# Patient Record
Sex: Female | Born: 1990
Health system: Southern US, Community
[De-identification: ages and names within clinical notes are randomized; demographics above are authoritative.]

## PROBLEM LIST (undated history)

## (undated) DIAGNOSIS — F1010 Alcohol abuse, uncomplicated: Secondary | ICD-10-CM

## (undated) DIAGNOSIS — F4481 Dissociative identity disorder: Secondary | ICD-10-CM

## (undated) DIAGNOSIS — B20 Human immunodeficiency virus [HIV] disease: Principal | ICD-10-CM

## (undated) DIAGNOSIS — Z21 Asymptomatic human immunodeficiency virus [HIV] infection status: Secondary | ICD-10-CM

## (undated) DIAGNOSIS — Z915 Personal history of self-harm: Secondary | ICD-10-CM

## (undated) HISTORY — PX: DILATION AND CURETTAGE OF UTERUS: SHX78

## (undated) HISTORY — DX: Dissociative identity disorder: F44.81

## (undated) HISTORY — DX: Alcohol abuse, uncomplicated: F10.10

## (undated) HISTORY — DX: Human immunodeficiency virus (HIV) disease: B20

## (undated) HISTORY — DX: Personal history of self-harm: Z91.5

---

## 2008-10-21 ENCOUNTER — Emergency Department (HOSPITAL_COMMUNITY): Admission: EM | Admit: 2008-10-21 | Discharge: 2008-10-21 | Payer: Self-pay | Admitting: Emergency Medicine

## 2010-10-17 LAB — URINALYSIS, ROUTINE W REFLEX MICROSCOPIC
Glucose, UA: NEGATIVE mg/dL
Leukocytes, UA: NEGATIVE
Specific Gravity, Urine: 1.027 (ref 1.005–1.030)
pH: 7 (ref 5.0–8.0)

## 2010-10-17 LAB — POCT I-STAT, CHEM 8
Calcium, Ion: 1.18 mmol/L (ref 1.12–1.32)
Chloride: 106 mEq/L (ref 96–112)
Creatinine, Ser: 1 mg/dL (ref 0.4–1.2)
Glucose, Bld: 101 mg/dL — ABNORMAL HIGH (ref 70–99)
HCT: 43 % (ref 36.0–46.0)
Hemoglobin: 14.6 g/dL (ref 12.0–15.0)

## 2010-10-17 LAB — URINE MICROSCOPIC-ADD ON

## 2013-03-15 DIAGNOSIS — O1410 Severe pre-eclampsia, unspecified trimester: Secondary | ICD-10-CM | POA: Insufficient documentation

## 2015-02-25 ENCOUNTER — Encounter (HOSPITAL_BASED_OUTPATIENT_CLINIC_OR_DEPARTMENT_OTHER): Payer: Self-pay | Admitting: *Deleted

## 2015-02-25 ENCOUNTER — Emergency Department (HOSPITAL_BASED_OUTPATIENT_CLINIC_OR_DEPARTMENT_OTHER)
Admission: EM | Admit: 2015-02-25 | Discharge: 2015-02-25 | Disposition: A | Discharge status: Home / Self Care | Payer: Medicaid Other | Attending: Emergency Medicine | Admitting: Emergency Medicine

## 2015-02-25 DIAGNOSIS — R1030 Lower abdominal pain, unspecified: Secondary | ICD-10-CM | POA: Diagnosis not present

## 2015-02-25 DIAGNOSIS — Z3A09 9 weeks gestation of pregnancy: Secondary | ICD-10-CM | POA: Diagnosis not present

## 2015-02-25 DIAGNOSIS — Z79899 Other long term (current) drug therapy: Secondary | ICD-10-CM | POA: Insufficient documentation

## 2015-02-25 DIAGNOSIS — R197 Diarrhea, unspecified: Secondary | ICD-10-CM | POA: Insufficient documentation

## 2015-02-25 DIAGNOSIS — Z21 Asymptomatic human immunodeficiency virus [HIV] infection status: Secondary | ICD-10-CM | POA: Insufficient documentation

## 2015-02-25 DIAGNOSIS — O9989 Other specified diseases and conditions complicating pregnancy, childbirth and the puerperium: Secondary | ICD-10-CM | POA: Diagnosis not present

## 2015-02-25 HISTORY — DX: Asymptomatic human immunodeficiency virus (hiv) infection status: Z21

## 2015-02-25 LAB — URINE MICROSCOPIC-ADD ON

## 2015-02-25 LAB — CBC WITH DIFFERENTIAL/PLATELET
Band Neutrophils: 5 % (ref 0–10)
Basophils Absolute: 0 10*3/uL (ref 0.0–0.1)
Basophils Relative: 0 % (ref 0–1)
Eosinophils Absolute: 0.1 10*3/uL (ref 0.0–0.7)
Eosinophils Relative: 2 % (ref 0–5)
HCT: 37 % (ref 36.0–46.0)
Hemoglobin: 12.1 g/dL (ref 12.0–15.0)
Lymphocytes Relative: 28 % (ref 12–46)
Lymphs Abs: 1.7 10*3/uL (ref 0.7–4.0)
MCH: 27.6 pg (ref 26.0–34.0)
MCHC: 32.7 g/dL (ref 30.0–36.0)
MCV: 84.3 fL (ref 78.0–100.0)
Monocytes Absolute: 0.4 10*3/uL (ref 0.1–1.0)
Monocytes Relative: 6 % (ref 3–12)
Neutro Abs: 3.9 10*3/uL (ref 1.7–7.7)
Neutrophils Relative %: 59 % (ref 43–77)
Platelets: 264 10*3/uL (ref 150–400)
RBC: 4.39 MIL/uL (ref 3.87–5.11)
RDW: 13.5 % (ref 11.5–15.5)
WBC: 6.1 10*3/uL (ref 4.0–10.5)

## 2015-02-25 LAB — URINALYSIS, ROUTINE W REFLEX MICROSCOPIC
Bilirubin Urine: NEGATIVE
Glucose, UA: NEGATIVE mg/dL
Hgb urine dipstick: NEGATIVE
Ketones, ur: NEGATIVE mg/dL
Nitrite: NEGATIVE
Protein, ur: NEGATIVE mg/dL
Specific Gravity, Urine: 1.026 (ref 1.005–1.030)
Urobilinogen, UA: 0.2 mg/dL (ref 0.0–1.0)
pH: 6 (ref 5.0–8.0)

## 2015-02-25 LAB — BASIC METABOLIC PANEL
Anion gap: 8 (ref 5–15)
BUN: 8 mg/dL (ref 6–20)
CO2: 24 mmol/L (ref 22–32)
Calcium: 9.6 mg/dL (ref 8.9–10.3)
Chloride: 105 mmol/L (ref 101–111)
Creatinine, Ser: 0.72 mg/dL (ref 0.44–1.00)
GFR calc Af Amer: >60 mL/min (ref >60)
GFR calc non Af Amer: >60 mL/min (ref >60)
Glucose, Bld: 96 mg/dL (ref 65–99)
Potassium: 4.3 mmol/L (ref 3.5–5.1)
Sodium: 137 mmol/L (ref 135–145)

## 2015-02-25 MED ORDER — NITROFURANTOIN MONOHYD MACRO 100 MG PO CAPS: 100.0000 mg | ORAL_CAPSULE | Freq: Two times a day (BID) | ORAL | Status: DC

## 2015-02-25 NOTE — ED Notes (Signed)
PA notified Ultrasound is not available at this time

## 2015-02-25 NOTE — Discharge Instructions (Signed)
Abdominal Pain During Pregnancy Abdominal pain is common in pregnancy. Most of the time, it does not cause harm. There are many causes of abdominal pain. Some causes are more serious than others. Some of the causes of abdominal pain in pregnancy are easily diagnosed. Occasionally, the diagnosis takes time to understand. Other times, the cause is not determined. Abdominal pain can be a sign that something is very wrong with the pregnancy, or the pain may have nothing to do with the pregnancy at all. For this reason, always tell your health care provider if you have any abdominal discomfort. HOME CARE INSTRUCTIONS  Monitor your abdominal pain for any changes. The following actions may help to alleviate any discomfort you are experiencing:  Do not have sexual intercourse or put anything in your vagina until your symptoms go away completely.  Get plenty of rest until your pain improves.  Drink clear fluids if you feel nauseous. Avoid solid food as long as you are uncomfortable or nauseous.  Only take over-the-counter or prescription medicine as directed by your health care provider.  Keep all follow-up appointments with your health care provider. SEEK IMMEDIATE MEDICAL CARE IF:  You are bleeding, leaking fluid, or passing tissue from the vagina.  You have increasing pain or cramping.  You have persistent vomiting.  You have painful or bloody urination.  You have a fever.  You notice a decrease in your baby's movements.  You have extreme weakness or feel faint.  You have shortness of breath, with or without abdominal pain.  You develop a severe headache with abdominal pain.  You have abnormal vaginal discharge with abdominal pain.  You have persistent diarrhea.  You have abdominal pain that continues even after rest, or gets worse. MAKE SURE YOU:   Understand these instructions.  Will watch your condition.  Will get help right away if you are not doing well or get  worse. Document Released: 06/24/2005 Document Revised: 04/14/2013 Document Reviewed: 01/21/2013 [facility] Patient Information 2015 [place], [place]. This information is not intended to replace advice given to you by your health care provider. Make sure you discuss any questions you have with your health care provider.  Please contact your OB/GYN inform them of your visit today. Please follow-up as previously scheduled for your evaluation. If new or worsening signs or symptoms present please return to the women's emergency room for further evaluation and management. Please take medication as directed.

## 2015-02-25 NOTE — ED Provider Notes (Signed)
CSN: [id number]     Arrival date & time 02/25/15  1009 History   First MD Initiated Contact with Patient 02/25/15 1204     Chief Complaint  Patient presents with  . Abdominal Pain     HPI   24 year old female presents today with complaints of diarrhea. Patient reports that starting around 4 AM this morning she had loose stools, proximally 4. Patient reports that her son is also experiencing similar complaints. Patient denies fever, chills, chest pain, abdominal pain, vaginal bleeding or discharge. Patient reports that she is [redacted] weeks pregnant, has an OB/GYN, but reports that she was uncertain if they were open today. Patient states that she has had over the last several weeks intermittent lower abdominal pain, has contacted her OB/GYN about this and instructed to follow up in 2 days for her previously scheduled OB ultrasound. She reports that this is likely round ligament pain. Patient denies any pain at the time of my evaluation. Patient denies headache, dark or discolored urine, urinary frequency, vaginal discharge. Patient reports that she is taking metronidazole as prescribed by her OB/GYN for bacterial vaginosis, notes that this was prescribed several weeks ago but began taking on Thursday. Patient reports this is the fourth time she's been pregnant, with one normal delivery 24 months ago, with 3 miscarriages. Patient reports she's seen at [facility] followed by Dr.Matern. Follow-up appointment in 2 days, she is prescribed Phenergan for nausea and vomiting. Last ultrasound was at 5 weeks which showed no significant findings.   Past Medical History  Diagnosis Date  . HIV antibody positive    Past Surgical History  Procedure Laterality Date  . Dilation and curettage of uterus    . Cesarean section     No family history on file. Social History  Substance Use Topics  . Smoking status: Never Smoker   . Smokeless tobacco: Never Used  . Alcohol Use: No   OB History    Gravida Para Term  Preterm AB TAB SAB Ectopic Multiple Living   1              Review of Systems  All other systems reviewed and are negative.  Allergies  Ibuprofen  Home Medications   Prior to Admission medications   Medication Sig Start Date End Date Taking? Authorizing Provider  elvitegravir-cobicistat-emtricitabine-tenofovir (STRIBILD) 150-150-200-300 MG TABS tablet Take 1 tablet by mouth daily with breakfast.   Yes Historical Provider, MD  metroNIDAZOLE (FLAGYL) 500 MG tablet Take 500 mg by mouth 2 (two) times daily.   Yes Historical Provider, MD  promethazine (PHENERGAN) 12.5 MG tablet Take 12.5 mg by mouth every 6 (six) hours as needed for nausea or vomiting.   Yes Historical Provider, MD  nitrofurantoin, macrocrystal-monohydrate, (MACROBID) 100 MG capsule Take 1 capsule (100 mg total) by mouth 2 (two) times daily. 02/25/15   Eyvonne Mechanic, PA-C   BP 97/78 mmHg  Pulse 78  Temp(Src) 98 F (36.7 C) (Oral)  Resp 18  Ht 5\' 1"  (1.549 m)  Wt 175 lb (79.379 kg)  BMI 33.08 kg/m2  SpO2 93%  LMP 12/25/2014 Physical Exam  Constitutional: She is oriented to person, place, and time. She appears well-developed and well-nourished.  HENT:  Head: Normocephalic and atraumatic.  Eyes: Conjunctivae are normal. Pupils are equal, round, and reactive to light. Right eye exhibits no discharge. Left eye exhibits no discharge. No scleral icterus.  Neck: Normal range of motion. No JVD present. No tracheal deviation present.  Cardiovascular: Normal rate, regular  rhythm, normal heart sounds and intact distal pulses.  Exam reveals no gallop and no friction rub.   No murmur heard. Pulmonary/Chest: Effort normal and breath sounds normal. No stridor. No respiratory distress. She has no wheezes. She has no rales. She exhibits no tenderness.  Abdominal: Soft. Bowel sounds are normal. She exhibits no distension and no mass. There is no tenderness. There is no rebound and no guarding.  Musculoskeletal: Normal range of motion.  She exhibits no edema or tenderness.  Neurological: She is alert and oriented to person, place, and time. Coordination normal.  Skin: Skin is warm and dry.  Psychiatric: She has a normal mood and affect. Her behavior is normal. Judgment and thought content normal.  Nursing note and vitals reviewed.   ED Course  Procedures (including critical care time) Labs Review Labs Reviewed  URINALYSIS, ROUTINE W REFLEX MICROSCOPIC (NOT AT [facility]) - Abnormal; Notable for the following:    APPearance CLOUDY (*)    Leukocytes, UA MODERATE (*)    All other components within normal limits  URINE MICROSCOPIC-ADD ON - Abnormal; Notable for the following:    Squamous Epithelial / LPF FEW (*)    Bacteria, UA MANY (*)    All other components within normal limits  URINE CULTURE  CBC WITH DIFFERENTIAL/PLATELET  BASIC METABOLIC PANEL    Imaging Review No results found. I have personally reviewed and evaluated these images and lab results as part of my medical decision-making.   EKG Interpretation None      MDM   Final diagnoses:  Lower abdominal pain  Diarrhea    Labs: CBC, BMP, urinalysis- moderate leukocytes, many bacteria, WBC 2150  Imaging:  Consults:  Therapeutics:  Discharge Meds:   Assessment/Plan: 24 year old female presents today with diarrhea. Patient reports she is tolerating by mouth, is afebrile, and has no active abdominal pain at the time of my evaluation. Patient does report that over the last several weeks she's had lower abdominal pain, has contacted her OB/GYN about this reporting that this is most likely round ligament pain, and that she could be evaluated in her office in 2 days. Due to patient's pain free exam, close follow-up, and no active symptoms, vaginal bleeding or vaginal discharge no need for emergent ultrasound or further evaluation here in the ED. Patient given strict return precautions, verbalized understanding and agreement to today's plan. Patient was  informed of her urinalysis results which could likely represent a urinary tract infection. Patient reports that she is currently being treated for bacterial vaginosis. I informed her to have repeat urinalysis with OB/GYN, to inform her of this result today, and guidance on treatment. Patient poor she does not want to be treated for potential UTI at this time.         Eyvonne Mechanic, PA-C 02/27/15 1536  Vanetta Mulders, MD 03/03/15 Windy Fast

## 2015-02-25 NOTE — ED Notes (Addendum)
Pt reports 4 loose stools since 0400. Pt is 3 months pregnant. +Nausea. Taking flagyl for BV at this time

## 2015-02-27 LAB — URINE CULTURE

## 2015-05-15 DIAGNOSIS — B20 Human immunodeficiency virus [HIV] disease: Secondary | ICD-10-CM | POA: Insufficient documentation

## 2015-05-15 DIAGNOSIS — N12 Tubulo-interstitial nephritis, not specified as acute or chronic: Secondary | ICD-10-CM | POA: Insufficient documentation

## 2015-05-15 DIAGNOSIS — O98719 Human immunodeficiency virus [HIV] disease complicating pregnancy, unspecified trimester: Secondary | ICD-10-CM

## 2015-05-15 DIAGNOSIS — Z3492 Encounter for supervision of normal pregnancy, unspecified, second trimester: Secondary | ICD-10-CM | POA: Insufficient documentation

## 2015-07-06 DIAGNOSIS — F410 Panic disorder [episodic paroxysmal anxiety] without agoraphobia: Secondary | ICD-10-CM | POA: Insufficient documentation

## 2015-08-07 DIAGNOSIS — O0993 Supervision of high risk pregnancy, unspecified, third trimester: Secondary | ICD-10-CM | POA: Insufficient documentation

## 2015-08-07 DIAGNOSIS — O0990 Supervision of high risk pregnancy, unspecified, unspecified trimester: Secondary | ICD-10-CM | POA: Insufficient documentation

## 2015-08-11 DIAGNOSIS — O4290 Premature rupture of membranes, unspecified as to length of time between rupture and onset of labor, unspecified weeks of gestation: Secondary | ICD-10-CM | POA: Insufficient documentation

## 2015-12-18 DIAGNOSIS — B20 Human immunodeficiency virus [HIV] disease: Secondary | ICD-10-CM | POA: Insufficient documentation

## 2015-12-18 DIAGNOSIS — R4689 Other symptoms and signs involving appearance and behavior: Secondary | ICD-10-CM

## 2015-12-18 DIAGNOSIS — R4589 Other symptoms and signs involving emotional state: Secondary | ICD-10-CM | POA: Insufficient documentation

## 2015-12-18 DIAGNOSIS — T50901A Poisoning by unspecified drugs, medicaments and biological substances, accidental (unintentional), initial encounter: Secondary | ICD-10-CM | POA: Insufficient documentation

## 2015-12-19 DIAGNOSIS — F332 Major depressive disorder, recurrent severe without psychotic features: Secondary | ICD-10-CM | POA: Insufficient documentation

## 2016-01-15 ENCOUNTER — Encounter: Payer: Self-pay | Admitting: Infectious Disease

## 2016-01-15 ENCOUNTER — Ambulatory Visit (INDEPENDENT_AMBULATORY_CARE_PROVIDER_SITE_OTHER): Payer: Self-pay | Admitting: Infectious Disease

## 2016-01-15 VITALS — BP 102/64 | HR 106 | Temp 98.1°F | Ht 61.0 in | Wt 186.0 lb

## 2016-01-15 DIAGNOSIS — Z8619 Personal history of other infectious and parasitic diseases: Secondary | ICD-10-CM | POA: Insufficient documentation

## 2016-01-15 DIAGNOSIS — Z113 Encounter for screening for infections with a predominantly sexual mode of transmission: Secondary | ICD-10-CM

## 2016-01-15 DIAGNOSIS — B20 Human immunodeficiency virus [HIV] disease: Secondary | ICD-10-CM

## 2016-01-15 DIAGNOSIS — F332 Major depressive disorder, recurrent severe without psychotic features: Secondary | ICD-10-CM

## 2016-01-15 DIAGNOSIS — F4310 Post-traumatic stress disorder, unspecified: Secondary | ICD-10-CM | POA: Insufficient documentation

## 2016-01-15 DIAGNOSIS — O98713 Human immunodeficiency virus [HIV] disease complicating pregnancy, third trimester: Secondary | ICD-10-CM

## 2016-01-15 DIAGNOSIS — F419 Anxiety disorder, unspecified: Secondary | ICD-10-CM | POA: Insufficient documentation

## 2016-01-15 HISTORY — DX: Human immunodeficiency virus (HIV) disease: B20

## 2016-01-15 LAB — CBC WITH DIFFERENTIAL/PLATELET
BASOS ABS: 0 {cells}/uL (ref 0–200)
Basophils Relative: 0 %
EOS ABS: 120 {cells}/uL (ref 15–500)
EOS PCT: 3 %
HCT: 37.7 % (ref 35.0–45.0)
Hemoglobin: 12 g/dL (ref 11.7–15.5)
Lymphocytes Relative: 49 %
Lymphs Abs: 1960 cells/uL (ref 850–3900)
MCH: 26.5 pg — AB (ref 27.0–33.0)
MCHC: 31.8 g/dL — AB (ref 32.0–36.0)
MCV: 83.4 fL (ref 80.0–100.0)
MONOS PCT: 10 %
MPV: 10.7 fL (ref 7.5–12.5)
Monocytes Absolute: 400 cells/uL (ref 200–950)
NEUTROS ABS: 1520 {cells}/uL (ref 1500–7800)
NEUTROS PCT: 38 %
PLATELETS: 254 10*3/uL (ref 140–400)
RBC: 4.52 MIL/uL (ref 3.80–5.10)
RDW: 15.3 % — ABNORMAL HIGH (ref 11.0–15.0)
WBC: 4 10*3/uL (ref 3.8–10.8)

## 2016-01-15 LAB — COMPLETE METABOLIC PANEL WITH GFR
ALT: 11 U/L (ref 6–29)
AST: 15 U/L (ref 10–30)
Albumin: 3.8 g/dL (ref 3.6–5.1)
Alkaline Phosphatase: 57 U/L (ref 33–115)
BUN: 11 mg/dL (ref 7–25)
CHLORIDE: 106 mmol/L (ref 98–110)
CO2: 24 mmol/L (ref 20–31)
CREATININE: 0.71 mg/dL (ref 0.50–1.10)
Calcium: 8.6 mg/dL (ref 8.6–10.2)
GFR, Est Non African American: >89 mL/min (ref >=60)
Glucose, Bld: 79 mg/dL (ref 65–99)
POTASSIUM: 4.3 mmol/L (ref 3.5–5.3)
Sodium: 137 mmol/L (ref 135–146)
Total Bilirubin: 0.3 mg/dL (ref 0.2–1.2)
Total Protein: 6.8 g/dL (ref 6.1–8.1)

## 2016-01-15 MED ORDER — DOLUTEGRAVIR SODIUM 50 MG PO TABS: 50.0000 mg | ORAL_TABLET | Freq: Every day | ORAL | Status: DC

## 2016-01-15 MED ORDER — FLUOXETINE HCL 10 MG PO TABS: 10.0000 mg | ORAL_TABLET | Freq: Every day | ORAL | Status: DC

## 2016-01-15 MED ORDER — EMTRICITABINE-TENOFOVIR AF 200-25 MG PO TABS: 1.0000 | ORAL_TABLET | Freq: Every day | ORAL | Status: DC

## 2016-01-15 NOTE — Progress Notes (Signed)
Chief complaint: here to establish care for HIV    Subjective:    Patient ID: Brenda Riley, female    DOB: 03-Aug-1990, 25 y.o.   MRN: [id number]  HPI  25 year old African American lady with HIV, poorly controlled due to poor compliance having been rx STRIBILD at [facility] but having been viremic. She had child at [facility] where HIV genotype did not show R (though I do not see INSTI R). She was at [facility] for severe post partum depression.  I reviewed with her various ARV regimens and we agreed to change her to [place] and Descovy though she may need to go to a PI based therapy such as Prezcobix and Descovy  Past Medical History  Diagnosis Date  . HIV antibody positive ([place])   . HIV disease ([place]) 01/15/2016    Past Surgical History  Procedure Laterality Date  . Dilation and curettage of uterus    . Cesarean section      No family history on file.    Social History   Social History  . Marital Status: Single    Spouse Name: N/A  . Number of Children: N/A  . Years of Education: N/A   Social History Main Topics  . Smoking status: Never Smoker   . Smokeless tobacco: Never Used  . Alcohol Use: 0.0 oz/week    0 Standard drinks or equivalent per week     Comment: socially  . Drug Use: Yes    Special: Marijuana  . Sexual Activity:    Partners: Male     Comment: patient given condoms   Other Topics Concern  . None   Social History Narrative    Allergies  Allergen Reactions  . Ibuprofen Other (See Comments)    Affects HIV meds Can't take with her other meds Affects HIV meds     Current outpatient prescriptions:  .  FLUoxetine (PROZAC) 10 MG tablet, Take 1 tablet (10 mg total) by mouth daily., Disp: 30 tablet, Rfl: 11 .  dolutegravir (TIVICAY) 50 MG tablet, Take 1 tablet (50 mg total) by mouth daily., Disp: 30 tablet, Rfl: 5 .  emtricitabine-tenofovir AF (DESCOVY) 200-25 MG tablet, Take 1 tablet by mouth daily., Disp: 30 tablet, Rfl: 5 .   promethazine (PHENERGAN) 12.5 MG tablet, Take 12.5 mg by mouth every 6 (six) hours as needed for nausea or vomiting. Reported on 01/15/2016, Disp: , Rfl:    Review of Systems  Constitutional: Negative for fever, chills, diaphoresis, activity change, appetite change, fatigue and unexpected weight change.  HENT: Negative for congestion, rhinorrhea, sinus pressure, sneezing, sore throat and trouble swallowing.   Eyes: Negative for photophobia and visual disturbance.  Respiratory: Negative for cough, chest tightness, shortness of breath, wheezing and stridor.   Cardiovascular: Negative for chest pain, palpitations and leg swelling.  Gastrointestinal: Negative for nausea, vomiting, abdominal pain, diarrhea, constipation, blood in stool, abdominal distention and anal bleeding.  Genitourinary: Negative for dysuria, hematuria, flank pain and difficulty urinating.  Musculoskeletal: Negative for myalgias, back pain, joint swelling, arthralgias and gait problem.  Skin: Negative for color change, pallor, rash and wound.  Neurological: Negative for dizziness, tremors, weakness and light-headedness.  Hematological: Negative for adenopathy. Does not bruise/bleed easily.  Psychiatric/Behavioral: Negative for behavioral problems, confusion, sleep disturbance, dysphoric mood, decreased concentration and agitation.       Objective:   Physical Exam  Constitutional: She is oriented to person, place, and time. She appears well-developed and well-nourished. No distress.  HENT:  Head: Normocephalic and atraumatic.  Mouth/Throat: No oropharyngeal exudate.  Eyes: Conjunctivae and EOM are normal. No scleral icterus.  Neck: Normal range of motion. Neck supple.  Cardiovascular: Normal rate and regular rhythm.   Pulmonary/Chest: Effort normal. No respiratory distress. She has no wheezes.  Abdominal: She exhibits no distension.  Musculoskeletal: She exhibits no edema or tenderness.  Neurological: She is alert and  oriented to person, place, and time. She exhibits normal muscle tone. Coordination normal.  Skin: Skin is warm and dry. No rash noted. She is not diaphoretic. No erythema. No pallor.  Psychiatric: Judgment and thought content normal. Her mood appears anxious. She is withdrawn.  Very poor eye contact          Assessment & Plan:   HIV disease with poor compliance:  Check labs including genotype and HLA B701 today, hep panel Change to Tivicay and [employer], ADAP has been submitted  RTC to see Pharmacy in 2 weeks, labs again in 4 weeks and appt with me in 6 weeks  Depression: continue SSRI and should engage with Grayland Ormond and or Leveda Anna  I spent greater than 60 minutes with the patient including greater than 50% of time in face to face counsel of the patient of her HIV, her new HIV regimen, nature and consequences of HIV virological failure with resistance, depression and in coordination of her care.

## 2016-01-15 NOTE — Patient Instructions (Signed)
wE NEED TO ENROLL YOU INTO [facility] TODAY  WE NEED BLOOD WORK TODAY  RTC TO SEE Brenda Riley IN 2 WEEKS  WE NEED ANOTHER APPT IN 4 WEEKS FOR BLOOD WORK AND THEN APPT WITH DR VAN DAM IN 6 WEEKS

## 2016-01-16 LAB — HEPATITIS B SURFACE ANTIBODY,QUALITATIVE: Hep B S Ab: POSITIVE — AB

## 2016-01-16 LAB — HEPATITIS B SURFACE ANTIGEN: Hepatitis B Surface Ag: NEGATIVE

## 2016-01-16 LAB — RPR

## 2016-01-17 LAB — T-HELPER CELL (CD4) - (RCID CLINIC ONLY)
CD4 % Helper T Cell: 31 % — ABNORMAL LOW (ref 33–55)
CD4 T Cell Abs: 620 /uL (ref 400–2700)

## 2016-01-18 LAB — HIV RNA, RTPCR W/R GT (RTI, PI,INT)
HIV 1 RNA Quant: 10500 copies/mL — ABNORMAL HIGH
HIV-1 RNA QUANT, LOG: 4.02 {Log_copies}/mL — AB

## 2016-01-19 LAB — HLA B*5701: HLA-B 5701 W/RFLX HLA-B HIGH: NEGATIVE

## 2016-01-20 LAB — RFLX HIV-1 INTEGRASE GENOTYPE

## 2016-01-20 LAB — HIV-1 GENOTYPE: HIV-1 GENOTYPE: DETECTED — AB

## 2016-01-22 ENCOUNTER — Other Ambulatory Visit: Payer: Self-pay | Admitting: *Deleted

## 2016-01-22 MED ORDER — DOLUTEGRAVIR SODIUM 50 MG PO TABS: 50.0000 mg | ORAL_TABLET | Freq: Every day | ORAL | Status: DC

## 2016-01-22 MED ORDER — EMTRICITABINE-TENOFOVIR AF 200-25 MG PO TABS: 1.0000 | ORAL_TABLET | Freq: Every day | ORAL | Status: DC

## 2016-02-07 ENCOUNTER — Encounter: Payer: Self-pay | Admitting: Licensed Clinical Social Worker

## 2016-02-19 ENCOUNTER — Ambulatory Visit: Payer: Medicaid Other

## 2016-02-20 ENCOUNTER — Other Ambulatory Visit (INDEPENDENT_AMBULATORY_CARE_PROVIDER_SITE_OTHER): Payer: Self-pay

## 2016-02-20 DIAGNOSIS — B20 Human immunodeficiency virus [HIV] disease: Secondary | ICD-10-CM

## 2016-02-20 LAB — COMPLETE METABOLIC PANEL WITH GFR
ALT: 9 U/L (ref 6–29)
AST: 15 U/L (ref 10–30)
Albumin: 3.4 g/dL — ABNORMAL LOW (ref 3.6–5.1)
Alkaline Phosphatase: 57 U/L (ref 33–115)
BUN: 10 mg/dL (ref 7–25)
CALCIUM: 8.9 mg/dL (ref 8.6–10.2)
CHLORIDE: 109 mmol/L (ref 98–110)
CO2: 25 mmol/L (ref 20–31)
CREATININE: 0.9 mg/dL (ref 0.50–1.10)
GFR, Est African American: >89 mL/min (ref >=60)
GFR, Est Non African American: 89 mL/min (ref >=60)
GLUCOSE: 71 mg/dL (ref 65–99)
Potassium: 4.3 mmol/L (ref 3.5–5.3)
SODIUM: 139 mmol/L (ref 135–146)
Total Bilirubin: 0.3 mg/dL (ref 0.2–1.2)
Total Protein: 6.9 g/dL (ref 6.1–8.1)

## 2016-02-20 LAB — CBC WITH DIFFERENTIAL/PLATELET
Basophils Absolute: 0 cells/uL (ref 0–200)
Basophils Relative: 0 %
EOS PCT: 2 %
Eosinophils Absolute: 62 cells/uL (ref 15–500)
HEMATOCRIT: 36.1 % (ref 35.0–45.0)
HEMOGLOBIN: 11.4 g/dL — AB (ref 11.7–15.5)
LYMPHS ABS: 1333 {cells}/uL (ref 850–3900)
Lymphocytes Relative: 43 %
MCH: 26.7 pg — ABNORMAL LOW (ref 27.0–33.0)
MCHC: 31.6 g/dL — ABNORMAL LOW (ref 32.0–36.0)
MCV: 84.5 fL (ref 80.0–100.0)
MONOS PCT: 9 %
MPV: 10.5 fL (ref 7.5–12.5)
Monocytes Absolute: 279 cells/uL (ref 200–950)
NEUTROS ABS: 1426 {cells}/uL — AB (ref 1500–7800)
NEUTROS PCT: 46 %
Platelets: 275 10*3/uL (ref 140–400)
RBC: 4.27 MIL/uL (ref 3.80–5.10)
RDW: 15.5 % — AB (ref 11.0–15.0)
WBC: 3.1 10*3/uL — AB (ref 3.8–10.8)

## 2016-02-21 LAB — T-HELPER CELL (CD4) - (RCID CLINIC ONLY)
CD4 % Helper T Cell: 35 % (ref 33–55)
CD4 T CELL ABS: 470 /uL (ref 400–2700)

## 2016-02-22 LAB — HIV-1 RNA ULTRAQUANT REFLEX TO GENTYP+

## 2016-03-04 ENCOUNTER — Ambulatory Visit (INDEPENDENT_AMBULATORY_CARE_PROVIDER_SITE_OTHER): Payer: Self-pay | Admitting: Infectious Disease

## 2016-03-04 VITALS — BP 107/76 | HR 85 | Temp 98.9°F | Wt 191.0 lb

## 2016-03-04 DIAGNOSIS — F332 Major depressive disorder, recurrent severe without psychotic features: Secondary | ICD-10-CM

## 2016-03-04 DIAGNOSIS — F4310 Post-traumatic stress disorder, unspecified: Secondary | ICD-10-CM

## 2016-03-04 DIAGNOSIS — B20 Human immunodeficiency virus [HIV] disease: Secondary | ICD-10-CM

## 2016-03-04 DIAGNOSIS — F419 Anxiety disorder, unspecified: Secondary | ICD-10-CM

## 2016-03-04 MED ORDER — FLUOXETINE HCL 10 MG PO TABS: 10.0000 mg | ORAL_TABLET | Freq: Every day | ORAL | 11 refills | Status: DC

## 2016-03-04 MED ORDER — EMTRICITABINE-TENOFOVIR AF 200-25 MG PO TABS: 1.0000 | ORAL_TABLET | Freq: Every day | ORAL | 11 refills | Status: DC

## 2016-03-04 MED ORDER — TRIAMCINOLONE ACETONIDE 0.025 % EX CREA: 1.0000 "application " | TOPICAL_CREAM | Freq: Two times a day (BID) | CUTANEOUS | 2 refills | Status: DC

## 2016-03-04 MED ORDER — DOLUTEGRAVIR SODIUM 50 MG PO TABS: 50.0000 mg | ORAL_TABLET | Freq: Every day | ORAL | 11 refills | Status: DC

## 2016-03-04 NOTE — Progress Notes (Signed)
Chief complaint: followup for HIV    Subjective:    Patient ID: Brenda Riley, female    DOB: 1991-02-03, 25 y.o.   MRN: [id number]  HPI  25 year old African American lady with HIV, poorly controlled due to poor compliance having [facility] but having been viremic. [facility] where HIV genotype did not show R (though I do not see INSTI R). [facility].  I reviewed with her various ARV regimens and we agreed to [facility] and Descovy and fortunately since then she has come under control with VL <20. [facility]. She is on SSRI and is about to run out. [facility] [place]. She should also be followed here by Lennox LaityJodi.  She is in need of pap smear.  Past Medical History:  Diagnosis Date  . HIV antibody positive ([facility])   . HIV disease ([facility]) 01/15/2016    Past Surgical History:  Procedure Laterality Date  . CESAREAN SECTION    . DILATION AND CURETTAGE OF UTERUS      No family history on file.    Social History   Social History  . Marital status: Single    Spouse name: N/A  . Number of children: N/A  . Years of education: N/A   Social History Main Topics  . Smoking status: Never Smoker  . Smokeless tobacco: Never Used  . Alcohol use 0.0 oz/week     Comment: socially  . Drug use:     Types: Marijuana  . Sexual activity: Yes    Partners: Male     Comment: patient given condoms   Other Topics Concern  . Not on file   Social History Narrative  . No narrative on file    Allergies  Allergen Reactions  . Ibuprofen Other (See Comments)    Affects HIV meds Can't take with her other meds Affects HIV meds     Current Outpatient Prescriptions:  .  dolutegravir (TIVICAY) 50 MG tablet, Take 1 tablet (50 mg total) by mouth daily., Disp: 30 tablet, Rfl: 11 .  emtricitabine-tenofovir AF (DESCOVY) 200-25 MG tablet, Take 1 tablet by  mouth daily., Disp: 30 tablet, Rfl: 11 .  FLUoxetine (PROZAC) 10 MG tablet, Take 1 tablet (10 mg total) by mouth daily., Disp: 30 tablet, Rfl: 11 .  promethazine (PHENERGAN) 12.5 MG tablet, Take 12.5 mg by mouth every 6 (six) hours as needed for nausea or vomiting. Reported on 01/15/2016, Disp: , Rfl:  .  triamcinolone (KENALOG) 0.025 % cream, Apply 1 application topically 2 (two) times daily., Disp: 30 g, Rfl: 2   Review of Systems  Constitutional: Negative for activity change, appetite change, chills, diaphoresis, fatigue, fever and unexpected weight change.  HENT: Negative for congestion, rhinorrhea, sinus pressure, sneezing, sore throat and trouble swallowing.   Eyes: Negative for photophobia and visual disturbance.  Respiratory: Negative for cough, chest tightness, shortness of breath, wheezing and stridor.   Cardiovascular: Negative for chest pain, palpitations and leg swelling.  Gastrointestinal: Negative for abdominal distention, abdominal pain, anal bleeding, blood in stool, constipation, diarrhea, nausea and vomiting.  Genitourinary: Negative for difficulty urinating, dysuria, flank pain and hematuria.  Musculoskeletal: Negative for arthralgias, back pain, gait problem, joint swelling and myalgias.  Skin: Negative for color change, pallor, rash and wound.  Neurological: Negative for dizziness, tremors, weakness and light-headedness.  Hematological: Negative for adenopathy. Does not bruise/bleed easily.  Psychiatric/Behavioral: Negative for agitation, behavioral problems, confusion, decreased concentration, dysphoric mood and sleep disturbance.       Objective:   Physical Exam  Constitutional: She is oriented to person, place, and time. She appears well-developed and well-nourished. No distress.  HENT:  Head: Normocephalic and atraumatic.  Mouth/Throat: No oropharyngeal exudate.  Eyes: Conjunctivae and EOM are normal. No scleral icterus.  Neck: Normal range of motion. Neck  supple.  Cardiovascular: Normal rate and regular rhythm.   Pulmonary/Chest: Effort normal. No respiratory distress. She has no wheezes.  Abdominal: She exhibits no distension.  Musculoskeletal: She exhibits no edema or tenderness.  Neurological: She is alert and oriented to person, place, and time. She exhibits normal muscle tone. Coordination normal.  Skin: Skin is warm and dry. No rash noted. She is not diaphoretic. No erythema. No pallor.  Psychiatric: Judgment and thought content normal. Her mood appears not anxious. She is not withdrawn.          Assessment & Plan:   HIV disease with poor compliance in the past but now controlled  Continue Tivicay and Descovy  ADAP renewed   Depression: continue SSRI and should engage with Lennox Laity and with clinic in [facility]  I spent greater than 25  minutes with the patient including greater than 50% of time in face to face counsel of the patient of her HIV, her new HIV regimen, depression and in coordination of her care.

## 2016-03-04 NOTE — Patient Instructions (Signed)
Please make an appt with Angelique Blonderenise for Pap smear  GREAT JOB taking your meds!!!

## 2016-04-01 ENCOUNTER — Telehealth: Payer: Self-pay | Admitting: *Deleted

## 2016-04-01 NOTE — Telephone Encounter (Signed)
Patient called, asking for a note for work for immediate leave. She states she has had nausea with vomiting for 3 days. She has kept her medication down, is able to eat and drink small amounts of food/water. Denies sick contacts. Patient does not have a primary care provider, needs a note for work today. RN advised patient to try Urgent Care - [place].  Andree CossHowell, Valree Feild M, RN

## 2016-04-01 NOTE — Telephone Encounter (Signed)
She can have phenergant 25mg  po q 6 hours PRN if she is still nauseous #60 tabletrs but not to drive while taking the medicine

## 2016-04-02 ENCOUNTER — Other Ambulatory Visit: Payer: Self-pay | Admitting: *Deleted

## 2016-04-02 DIAGNOSIS — R112 Nausea with vomiting, unspecified: Secondary | ICD-10-CM

## 2016-04-02 MED ORDER — PROMETHAZINE HCL 25 MG PO TABS: 25.0000 mg | ORAL_TABLET | Freq: Four times a day (QID) | ORAL | 0 refills | Status: DC | PRN

## 2016-04-02 NOTE — Telephone Encounter (Signed)
Excellent

## 2016-04-02 NOTE — Telephone Encounter (Signed)
Phoned in prescription to pharmacy, thanks.

## 2016-04-05 ENCOUNTER — Ambulatory Visit: Payer: Medicaid Other

## 2016-04-12 ENCOUNTER — Telehealth: Payer: Self-pay | Admitting: *Deleted

## 2016-04-12 ENCOUNTER — Ambulatory Visit: Payer: Medicaid Other

## 2016-04-12 NOTE — Telephone Encounter (Signed)
Message left requesting patient call for a new PAP smear appt.  Also, asked if the patient's nausea has improved since receiving recent phenergan prescription form Dr. Daiva EvesVan Dam.

## 2016-04-24 ENCOUNTER — Other Ambulatory Visit: Payer: Self-pay | Admitting: Infectious Disease

## 2016-04-25 ENCOUNTER — Other Ambulatory Visit: Payer: Self-pay | Admitting: Infectious Disease

## 2016-04-29 ENCOUNTER — Other Ambulatory Visit: Payer: Medicaid Other

## 2016-05-03 ENCOUNTER — Other Ambulatory Visit (INDEPENDENT_AMBULATORY_CARE_PROVIDER_SITE_OTHER): Payer: Self-pay

## 2016-05-03 DIAGNOSIS — B20 Human immunodeficiency virus [HIV] disease: Secondary | ICD-10-CM

## 2016-05-03 DIAGNOSIS — Z113 Encounter for screening for infections with a predominantly sexual mode of transmission: Secondary | ICD-10-CM

## 2016-05-03 LAB — COMPLETE METABOLIC PANEL WITH GFR
ALT: 9 U/L (ref 6–29)
AST: 15 U/L (ref 10–30)
Albumin: 3.6 g/dL (ref 3.6–5.1)
Alkaline Phosphatase: 51 U/L (ref 33–115)
BUN: 13 mg/dL (ref 7–25)
CO2: 24 mmol/L (ref 20–31)
Calcium: 8.8 mg/dL (ref 8.6–10.2)
Chloride: 106 mmol/L (ref 98–110)
Creat: 0.97 mg/dL (ref 0.50–1.10)
GFR, EST NON AFRICAN AMERICAN: 81 mL/min (ref >=60)
GFR, Est African American: >89 mL/min (ref >=60)
GLUCOSE: 85 mg/dL (ref 65–99)
POTASSIUM: 3.9 mmol/L (ref 3.5–5.3)
SODIUM: 138 mmol/L (ref 135–146)
Total Bilirubin: 0.3 mg/dL (ref 0.2–1.2)
Total Protein: 7.1 g/dL (ref 6.1–8.1)

## 2016-05-03 LAB — CBC WITH DIFFERENTIAL/PLATELET
BASOS ABS: 33 {cells}/uL (ref 0–200)
Basophils Relative: 1 %
EOS PCT: 3 %
Eosinophils Absolute: 99 cells/uL (ref 15–500)
HCT: 37.9 % (ref 35.0–45.0)
Hemoglobin: 11.9 g/dL (ref 11.7–15.5)
LYMPHS ABS: 1320 {cells}/uL (ref 850–3900)
Lymphocytes Relative: 40 %
MCH: 27 pg (ref 27.0–33.0)
MCHC: 31.4 g/dL — AB (ref 32.0–36.0)
MCV: 86.1 fL (ref 80.0–100.0)
MONOS PCT: 10 %
MPV: 10.6 fL (ref 7.5–12.5)
Monocytes Absolute: 330 cells/uL (ref 200–950)
NEUTROS ABS: 1518 {cells}/uL (ref 1500–7800)
Neutrophils Relative %: 46 %
PLATELETS: 280 10*3/uL (ref 140–400)
RBC: 4.4 MIL/uL (ref 3.80–5.10)
RDW: 14.7 % (ref 11.0–15.0)
WBC: 3.3 10*3/uL — AB (ref 3.8–10.8)

## 2016-05-03 LAB — T-HELPER CELL (CD4) - (RCID CLINIC ONLY)
CD4 T CELL ABS: 430 /uL (ref 400–2700)
CD4 T CELL HELPER: 36 % (ref 33–55)

## 2016-05-04 LAB — MICROALBUMIN / CREATININE URINE RATIO
Creatinine, Urine: 284 mg/dL (ref 20–320)
MICROALB UR: 0.6 mg/dL
MICROALB/CREAT RATIO: 2 ug/mg{creat} (ref <30)

## 2016-05-04 LAB — RPR

## 2016-05-06 LAB — HIV-1 RNA QUANT-NO REFLEX-BLD
HIV 1 RNA Quant: 29 copies/mL — ABNORMAL HIGH (ref <20)
HIV-1 RNA QUANT, LOG: 1.46 {Log_copies}/mL — AB (ref <1.30)

## 2016-05-13 ENCOUNTER — Ambulatory Visit: Payer: Medicaid Other | Admitting: Infectious Disease

## 2016-05-20 ENCOUNTER — Ambulatory Visit (INDEPENDENT_AMBULATORY_CARE_PROVIDER_SITE_OTHER): Payer: Self-pay | Admitting: Infectious Disease

## 2016-05-20 ENCOUNTER — Encounter: Payer: Self-pay | Admitting: Infectious Disease

## 2016-05-20 VITALS — BP 118/69 | HR 103 | Temp 98.6°F | Ht 61.0 in | Wt 198.0 lb

## 2016-05-20 DIAGNOSIS — B20 Human immunodeficiency virus [HIV] disease: Secondary | ICD-10-CM

## 2016-05-20 DIAGNOSIS — Z915 Personal history of self-harm: Secondary | ICD-10-CM

## 2016-05-20 DIAGNOSIS — F4310 Post-traumatic stress disorder, unspecified: Secondary | ICD-10-CM

## 2016-05-20 DIAGNOSIS — Z9151 Personal history of suicidal behavior: Secondary | ICD-10-CM

## 2016-05-20 DIAGNOSIS — F314 Bipolar disorder, current episode depressed, severe, without psychotic features: Secondary | ICD-10-CM

## 2016-05-20 DIAGNOSIS — F419 Anxiety disorder, unspecified: Secondary | ICD-10-CM

## 2016-05-20 DIAGNOSIS — F1010 Alcohol abuse, uncomplicated: Secondary | ICD-10-CM

## 2016-05-20 DIAGNOSIS — O98713 Human immunodeficiency virus [HIV] disease complicating pregnancy, third trimester: Secondary | ICD-10-CM

## 2016-05-20 DIAGNOSIS — F4481 Dissociative identity disorder: Secondary | ICD-10-CM

## 2016-05-20 DIAGNOSIS — F332 Major depressive disorder, recurrent severe without psychotic features: Secondary | ICD-10-CM

## 2016-05-20 DIAGNOSIS — A64 Unspecified sexually transmitted disease: Secondary | ICD-10-CM

## 2016-05-20 HISTORY — DX: Personal history of suicidal behavior: Z91.51

## 2016-05-20 HISTORY — DX: Dissociative identity disorder: F44.81

## 2016-05-20 HISTORY — DX: Alcohol abuse, uncomplicated: F10.10

## 2016-05-20 MED ORDER — AZITHROMYCIN 250 MG PO TABS
1000.0000 mg | ORAL_TABLET | Freq: Once | ORAL | Status: AC
  Administered 2016-05-20: 1000 mg via ORAL

## 2016-05-20 MED ORDER — CEFTRIAXONE SODIUM 250 MG IJ SOLR
250.0000 mg | Freq: Once | INTRAMUSCULAR | Status: AC
  Administered 2016-05-20: 250 mg via INTRAMUSCULAR

## 2016-05-20 MED ORDER — AMOXICILLIN-POT CLAVULANATE 875-125 MG PO TABS: 1.0000 | ORAL_TABLET | Freq: Two times a day (BID) | ORAL | 1 refills | Status: DC

## 2016-05-20 NOTE — Addendum Note (Signed)
Addended by: Wendall MolaOCKERHAM, JACQUELINE A on: 05/20/2016 03:31 PM   Modules accepted: Orders
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
## 2016-05-21 LAB — URINE CYTOLOGY ANCILLARY ONLY
Chlamydia: NEGATIVE
Neisseria Gonorrhea: NEGATIVE

## 2016-09-04 ENCOUNTER — Ambulatory Visit: Payer: Self-pay | Admitting: Infectious Disease

## 2016-10-29 ENCOUNTER — Ambulatory Visit: Payer: Self-pay | Admitting: Infectious Disease

## 2016-12-25 ENCOUNTER — Other Ambulatory Visit: Payer: Self-pay

## 2017-01-13 ENCOUNTER — Ambulatory Visit: Payer: Self-pay | Admitting: Infectious Disease

## 2017-01-20 ENCOUNTER — Other Ambulatory Visit: Payer: Self-pay

## 2017-01-20 ENCOUNTER — Encounter (HOSPITAL_BASED_OUTPATIENT_CLINIC_OR_DEPARTMENT_OTHER): Payer: Self-pay | Admitting: Emergency Medicine

## 2017-01-20 ENCOUNTER — Emergency Department (HOSPITAL_BASED_OUTPATIENT_CLINIC_OR_DEPARTMENT_OTHER)
Admission: EM | Admit: 2017-01-20 | Discharge: 2017-01-20 | Disposition: A | Discharge status: Home / Self Care | Payer: Self-pay | Attending: Emergency Medicine | Admitting: Emergency Medicine

## 2017-01-20 DIAGNOSIS — M79604 Pain in right leg: Secondary | ICD-10-CM | POA: Insufficient documentation

## 2017-01-20 DIAGNOSIS — Z79899 Other long term (current) drug therapy: Secondary | ICD-10-CM | POA: Insufficient documentation

## 2017-01-20 MED ORDER — IBUPROFEN 400 MG PO TABS
600.0000 mg | ORAL_TABLET | Freq: Once | ORAL | Status: AC
  Administered 2017-01-20: 01:00:00 600 mg via ORAL
  Filled 2017-01-20: qty 1

## 2017-01-20 MED ORDER — ACETAMINOPHEN 325 MG PO TABS
650.0000 mg | ORAL_TABLET | Freq: Once | ORAL | Status: AC
  Administered 2017-01-20: 650 mg via ORAL
  Filled 2017-01-20: qty 2

## 2017-01-20 NOTE — Discharge Instructions (Signed)
Elevated your legs  Purchase compression stockings  Take ibuprofen and tylenol according to labeled instructions

## 2017-01-20 NOTE — ED Triage Notes (Signed)
PT presents with c/o pain to right foot since she kicked a fork 3 weeks ago and now her entire leg hurts.

## 2017-01-20 NOTE — ED Notes (Signed)
EDP into room, prior to RN assessment, see MD notes, orders received to medicate and d/c. Care assumed at time of d/c.   

## 2017-01-20 NOTE — ED Notes (Signed)
Alert, NAD, calm, interactive, resps e/u, speaking in clear complete sentences, no dyspnea noted, skin W&D, c/o R leg pain, denies injury, CMS/ROM intact, (denies: sob, numbness, tingling, fever, NVD, dizziness or visual changes). [facility]
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
## 2017-01-31 ENCOUNTER — Encounter: Payer: Self-pay | Admitting: Infectious Disease

## 2017-02-14 ENCOUNTER — Other Ambulatory Visit: Payer: Self-pay | Admitting: *Deleted

## 2017-02-14 DIAGNOSIS — B20 Human immunodeficiency virus [HIV] disease: Secondary | ICD-10-CM

## 2017-02-14 MED ORDER — DOLUTEGRAVIR SODIUM 50 MG PO TABS: 50.0000 mg | ORAL_TABLET | Freq: Every day | ORAL | 1 refills | Status: DC

## 2017-02-14 MED ORDER — EMTRICITABINE-TENOFOVIR AF 200-25 MG PO TABS: 1.0000 | ORAL_TABLET | Freq: Every day | ORAL | 1 refills | Status: DC

## 2017-02-24 ENCOUNTER — Other Ambulatory Visit: Payer: Self-pay | Admitting: *Deleted

## 2017-02-24 DIAGNOSIS — B20 Human immunodeficiency virus [HIV] disease: Secondary | ICD-10-CM

## 2017-02-24 MED ORDER — DOLUTEGRAVIR SODIUM 50 MG PO TABS
50.0000 mg | ORAL_TABLET | Freq: Every day | ORAL | 1 refills | Status: DC
Start: 2017-02-24 — End: 2021-03-08

## 2017-02-24 MED ORDER — EMTRICITABINE-TENOFOVIR AF 200-25 MG PO TABS: 1.0000 | ORAL_TABLET | Freq: Every day | ORAL | 1 refills | Status: DC

## 2017-03-12 ENCOUNTER — Other Ambulatory Visit: Payer: Self-pay

## 2017-03-18 ENCOUNTER — Ambulatory Visit: Payer: Self-pay | Admitting: Infectious Disease

## 2017-05-16 ENCOUNTER — Other Ambulatory Visit: Payer: Self-pay

## 2018-01-03 DIAGNOSIS — F1210 Cannabis abuse, uncomplicated: Secondary | ICD-10-CM | POA: Insufficient documentation

## 2021-02-21 ENCOUNTER — Ambulatory Visit: Payer: Self-pay

## 2021-02-21 ENCOUNTER — Other Ambulatory Visit: Payer: Self-pay

## 2021-02-21 DIAGNOSIS — Z113 Encounter for screening for infections with a predominantly sexual mode of transmission: Secondary | ICD-10-CM

## 2021-02-21 DIAGNOSIS — B20 Human immunodeficiency virus [HIV] disease: Secondary | ICD-10-CM

## 2021-02-21 DIAGNOSIS — Z79899 Other long term (current) drug therapy: Secondary | ICD-10-CM

## 2021-03-06 ENCOUNTER — Telehealth: Payer: Self-pay

## 2021-03-06 ENCOUNTER — Other Ambulatory Visit (HOSPITAL_COMMUNITY): Payer: Self-pay

## 2021-03-06 NOTE — Telephone Encounter (Addendum)
RCID Patient Product/process development scientist completed.    The patient is insured through [employer].  Tivicay $0.00 Descovy $200.00 Patient will need a copay coupon card to make the copay $0.00 Brenda Riley is covered patient medical benefits, I will have to call insurance to make sure it do not need a PA.  We will continue to follow to see if copay assistance is needed.  Clearance Coots, CPhT Specialty Pharmacy Patient [facility] Phone: [phone] Fax:  [phone]

## 2021-03-07 ENCOUNTER — Other Ambulatory Visit (HOSPITAL_COMMUNITY): Payer: Self-pay

## 2021-03-08 ENCOUNTER — Encounter: Payer: Self-pay | Admitting: Infectious Diseases

## 2021-03-08 ENCOUNTER — Telehealth: Payer: Self-pay

## 2021-03-08 ENCOUNTER — Ambulatory Visit (INDEPENDENT_AMBULATORY_CARE_PROVIDER_SITE_OTHER): Payer: BC Managed Care – PPO | Admitting: Infectious Diseases

## 2021-03-08 ENCOUNTER — Ambulatory Visit (INDEPENDENT_AMBULATORY_CARE_PROVIDER_SITE_OTHER): Payer: BC Managed Care – PPO | Admitting: Pharmacist

## 2021-03-08 ENCOUNTER — Other Ambulatory Visit (HOSPITAL_COMMUNITY): Payer: Self-pay

## 2021-03-08 ENCOUNTER — Other Ambulatory Visit: Payer: Self-pay

## 2021-03-08 ENCOUNTER — Telehealth: Payer: Self-pay | Admitting: Pharmacist

## 2021-03-08 VITALS — BP 139/78 | HR 77 | Temp 98.2°F | Wt 226.4 lb

## 2021-03-08 DIAGNOSIS — Z113 Encounter for screening for infections with a predominantly sexual mode of transmission: Secondary | ICD-10-CM

## 2021-03-08 DIAGNOSIS — R202 Paresthesia of skin: Secondary | ICD-10-CM | POA: Diagnosis not present

## 2021-03-08 DIAGNOSIS — F314 Bipolar disorder, current episode depressed, severe, without psychotic features: Secondary | ICD-10-CM

## 2021-03-08 DIAGNOSIS — Z8619 Personal history of other infectious and parasitic diseases: Secondary | ICD-10-CM | POA: Insufficient documentation

## 2021-03-08 DIAGNOSIS — Z79899 Other long term (current) drug therapy: Secondary | ICD-10-CM | POA: Diagnosis not present

## 2021-03-08 DIAGNOSIS — B20 Human immunodeficiency virus [HIV] disease: Secondary | ICD-10-CM

## 2021-03-08 MED ORDER — BICTEGRAVIR-EMTRICITAB-TENOFOV 50-200-25 MG PO TABS
1.0000 | ORAL_TABLET | Freq: Every day | ORAL | 5 refills | Status: DC
  Filled 2021-03-08: qty 30, 30d supply, fill #0

## 2021-03-08 MED ORDER — FLUOXETINE HCL 10 MG PO TABS
10.0000 mg | ORAL_TABLET | Freq: Every day | ORAL | 1 refills | Status: DC
  Filled 2021-03-08: qty 30, 30d supply, fill #0
  Filled 2021-04-02: qty 30, 30d supply, fill #1

## 2021-03-08 NOTE — Telephone Encounter (Signed)
RCID Patient Advocate Encounter °  °Was successful in obtaining a [employer] copay card for Biktarvy.  This copay card will make the patients copay 0.00. ° °I have spoken with the patient.   ° °The billing information is as follows and has been shared with [facility]. ° ° ° ° ° ° ° °Yemaya Barnier, CPhT °Specialty Pharmacy Patient Advocate °[facility] °Phone: [phone] °Fax:  [phone]  °

## 2021-03-08 NOTE — Telephone Encounter (Signed)
Patient is interested in [place] once monthly injections. Lupita Leash, can you look into insurance coverage for her?  Thanks!  Margarite Gouge, PharmD, CPP Clinical Pharmacist Practitioner Infectious Diseases Clinical Pharmacist [facility]

## 2021-03-08 NOTE — Telephone Encounter (Signed)
RCID Patient Advocate Encounter  Brenda Riley is covered under patient medical benefits a PA is not required.  Patient will need to call for office & admin copay,  Ref :[phone]  [employer] Portal Claim    Brenda Riley, CPhT Specialty Pharmacy Patient [facility] Phone: [phone] Fax:  [phone]

## 2021-03-08 NOTE — Assessment & Plan Note (Signed)
Worsening depression with history of bipolar disorder. +suicidal thoughts without any plan today. Motivated to get better for her and her children (9 & 5).  Has used Prozac in the past with good effect - will restart this for her at previous dose 10 mg QD  Contracts for safety with me for herself and children today. We discussed referral to psychiatry - will plan to see her back virtually / in person in 1 month to discuss how she is doing on Prozac.  Emergency services for crisis management given to her today.  Her mother is very helpful and supportive - I encouraged her to continue to reach out to her for help.

## 2021-03-08 NOTE — Progress Notes (Signed)
Name: Brenda Riley  DOB: [birth year] MRN: [id number] PCP: Patient, No Pcp Per (Inactive)    Brief Narrative:  Brenda Riley is a 30 y.o. female with HIV, Dx 09-2012 CD4 nadir > 200 reported HIV Risk: heterosexual contact History of OIs: none Intake Labs: Hep B sAg (-), sAb (+) 2017, Hep A (), Hep C () Quantiferon () HLA B*5701 () G6PD: ()   Previous Regimens: 2014 - kaletra + combivir  Stribild Tivicay + Truvada 2017  Genotypes: None on file currently.   Subjective:   Chief Complaint  Patient presents with   New Patient (Initial Visit)    B20 - C/O tingling in hands and feet x 3 months. Pt reports wanting to get in control of anxiety and depression. Pt states that she cries randomly.       HPI: Brenda Riley is here to transfer care for HIV treatment. She has been doing well overall on Biktarvy once daily and may have only missed 1 dose over the last 1 month.  She does need to take zofran ODT with this every dose to avoid vomiting / nausea side effect. She is highly interested in [place] injections.   She confides in me today that she has been having worsening problems with depression including suicidal thoughts. Increased stress has been a trigger. She has her mother available to her for help and relief when stress with the kids comes about. She does have a history of previous suicide attempt back in 2017 or so when she ingested a lot of pills. She states she was officially diagnosed with bipolar disorder in the past. She is open to psychiatry referral and requesting to restart her prozac to help. She felt much better on this in the past.   Previously working with Dr. Tommy Medal here at [facility] in 2017, in care with [facility] since then. LOV at [facility] ID was 02/2020 - VL undetectable at that time. CD4 January 0221 850. Was on Biktarvy once daily and has continued this since then. She needs zofran to help with associated nausea with the medication. 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   Does not have a PCP but would like a recommendatoin for someone. Last pap smear > 5 years ago and needs an updated study. Remote history of abnormal results, not requiring intervention.     Depression screen PHQ 2/9 03/08/2021  Decreased Interest 3  Down, Depressed, Hopeless -  PHQ - 2 Score 3  Altered sleeping -  Tired, decreased energy -  Change in appetite -  Feeling bad or failure about yourself  -  Trouble concentrating -  Moving slowly or fidgety/restless -  Suicidal thoughts -  PHQ-9 Score -  Difficult doing work/chores -     ROS    Past Medical History:  Diagnosis Date   Alcohol abuse 05/20/2016   History of suicide attempt 05/20/2016   HIV antibody positive ([place])    HIV disease ([place]) 01/15/2016   Multiple personality disorder ([place]) 05/20/2016    Outpatient Medications Prior to Visit  Medication Sig Dispense Refill   ondansetron (ZOFRAN) 8 MG tablet Take 8 mg by mouth.     bictegravir-emtricitabine-tenofovir AF (BIKTARVY) 50-200-25 MG TABS tablet Take 1 tablet by mouth daily.     amoxicillin-clavulanate (AUGMENTIN) 875-125 MG tablet Take 1 tablet by mouth 2 (two) times daily. (Patient not taking: Reported on 03/08/2021) 20 tablet 1   dolutegravir (TIVICAY) 50 MG tablet Take 1 tablet (50 mg total) by mouth  daily. (Patient not taking: Reported on 03/08/2021) 30 tablet 1   emtricitabine-tenofovir AF (DESCOVY) 200-25 MG tablet Take 1 tablet by mouth daily. (Patient not taking: Reported on 03/08/2021) 30 tablet 1   FLUoxetine (PROZAC) 10 MG tablet Take 1 tablet (10 mg total) by mouth daily. (Patient not taking: Reported on 03/08/2021) 30 tablet 11   No facility-administered medications prior to visit.     No Known Allergies  Social History   Tobacco Use   Smoking status: Never   Smokeless tobacco: Never  Substance Use Topics   Alcohol use: Not Currently    Comment: socially   Drug use: Yes    Types: Marijuana    No family history on  file.  Social History   Substance and Sexual Activity  Sexual Activity Not Currently   Partners: Male   Comment: declined condoms     Objective:   Vitals:   03/08/21 1040  BP: 139/78  Pulse: 77  Temp: 98.2 F (36.8 C)  TempSrc: Oral  SpO2: 100%  Weight: 226 lb 6.4 oz (102.7 kg)   Body mass index is 42.78 kg/m.  Physical Exam  Lab Results Lab Results  Component Value Date   WBC 3.3 (L) 05/03/2016   HGB 11.9 05/03/2016   HCT 37.9 05/03/2016   MCV 86.1 05/03/2016   PLT 280 05/03/2016    Lab Results  Component Value Date   CREATININE 0.97 05/03/2016   BUN 13 05/03/2016   NA 138 05/03/2016   K 3.9 05/03/2016   CL 106 05/03/2016   CO2 24 05/03/2016    Lab Results  Component Value Date   ALT 9 05/03/2016   AST 15 05/03/2016   ALKPHOS 51 05/03/2016   BILITOT 0.3 05/03/2016    No results found for: CHOL, HDL, LDLCALC, LDLDIRECT, TRIG, CHOLHDL HIV 1 RNA Quant (copies/mL)  Date Value  05/03/2016 29 (H)  02/20/2016 <20  01/15/2016 10,500 (H)   CD4 T Cell Abs (/uL)  Date Value  05/03/2016 430  02/20/2016 470  01/15/2016 620     Assessment & Plan:   Problem List Items Addressed This Visit       Unprioritized   Severe bipolar I disorder, current or most recent episode depressed ([place])    Worsening depression with history of bipolar disorder. +suicidal thoughts without any plan today. Motivated to get better for her and her children (9 & 5).  Has used Prozac in the past with good effect - will restart this for her at previous dose 10 mg QD  Contracts for safety with me for herself and children today. We discussed referral to psychiatry - will plan to see her back virtually / in person in 1 month to discuss how she is doing on Prozac.  Emergency services for crisis management given to her today.  Her mother is very helpful and supportive - I encouraged her to continue to reach out to her for help.       Relevant Medications   FLUoxetine (PROZAC) 10 MG  tablet   Other Relevant Orders   Ambulatory referral to Psychiatry   Paresthesia of both hands    Sounds like they are possibly related to anxiety/panic and hyperventilation. Diabetes screen today.  Will check folate/b12 today. Iron panel to follow if needed.       Relevant Orders   Hemoglobin A1c   B12 and Folate Panel   HIV disease ([place]) - Primary    New patient here to transfer for HIV care.  Dx in 2013 with last VL undetectable and CD4 > 430. She is highly motivated to move to long acting injectables. Will check VL today and see where she is at regarding possibly switching. Reviewed previous ARVs and no resistance from what I have seen, though had some trouble with adherence on Stribild in the remote past. Check pregnancy test today via blood work or with follow up urine at next OV with pap smear.   I discussed with Clarisa Fling treatment options/side effects, benefits of treatment and long-term outcomes. I discussed how HIV is transmitted and the process of untreated HIV including increased risk for opportunistic infections, cancer, dementia and renal failure. Patient was counseled on routine HIV care including medication adherence, blood monitoring, necessary vaccines and follow up visits.   Will continue Biktarvy for HIV treatment - copay card provided. Will investigate with pharmacy re: cabenuva possibility.   General introduction to our clinic and integrated services.   Total clinic encounter time: 45 min        Relevant Medications   bictegravir-emtricitabine-tenofovir AF (BIKTARVY) 50-200-25 MG TABS tablet   Other Relevant Orders   QuantiFERON-TB Gold Plus   AMB REFERRAL TO COMMUNITY SERVICE AGENCY   hCG, serum, qualitative   Other Visit Diagnoses     Polypharmacy       Screening for venereal disease           Will plan to at least touch base with her virtually in 4 weeks to follow up on prozac re-initiation. If we can transition to cabenuva injectables we  will arrange schedule appropriately. She is interested in monthly dosing regimen.    Janene Madeira, MSN, NP-C [facility] [place] Pager: [phone] Office: [phone]  03/08/21  12:18 PM

## 2021-03-08 NOTE — Patient Instructions (Addendum)
Nice to meet you! Looking forward to working with you.    CABENUVA is the new medication to treat HIV. It is an injection in each butt cheek every 30 days at this time.  Largely the side effects were of course related to the injection itself, but during the studies where this was used to treat HIV patients there were only a very small (< 5) number of patients that stopped it due to pain from the shots. Most of the patients in the trials reported overall improvement in the pain associated with the shots over time.  Patient Information Link: https://gskpro.com/content/dam/global/hcpportal/en_US/Prescribing_Information/Cabenuva/pdf/CABENUVA-PI-PIL-[email or web address]-[email or web address].PDF#page=36   Will see what your blood work looks like and let you know what your insurance says regarding these injections.   Next visit we can do your pap smear at that time.     For primary care services, please call one of the following clinics for a new patient appointment:   Each clinic have different programs to support your health care needs when you don't have access to health insurance.   [facility] - the site on [facility]   2.   [facility] - [address]  [phone]  3.  [facility]  - Near [facility] in [place]  [phone]  4.  [facility] at [facility] - [address] [place],  [place]  [address]   [phone]

## 2021-03-08 NOTE — Progress Notes (Signed)
03/08/2021  HPI: Brenda Riley is a 30 y.o. female who presents to the [facility] clinic today to transfer care for her HIV infection.  Patient Active Problem List   Diagnosis Date Noted   History of chlamydia 03/08/2021   Cannabis abuse 01/03/2018   History of suicide attempt 05/20/2016   Multiple personality disorder (HCC) 05/20/2016   Severe bipolar I disorder, current or most recent episode depressed (HCC) 05/20/2016   Alcohol abuse 05/20/2016   Anxiety 01/15/2016   H/O infectious disease 01/15/2016   Neurosis, posttraumatic 01/15/2016   Severe episode of recurrent major depressive disorder (HCC) 12/19/2015   HIV disease (HCC) 12/18/2015   Suicidal behavior 12/18/2015   Anxiety attack 07/06/2015   Nephropyelitis 05/15/2015    Patient's Medications  New Prescriptions   No medications on file  Previous Medications   BICTEGRAVIR-EMTRICITABINE-TENOFOVIR AF (BIKTARVY) 50-200-25 MG TABS TABLET    Take 1 tablet by mouth daily.   FLUOXETINE (PROZAC) 10 MG TABLET    Take 1 tablet (10 mg total) by mouth daily.   ONDANSETRON (ZOFRAN) 8 MG TABLET    Take 8 mg by mouth.  Modified Medications   No medications on file  Discontinued Medications   No medications on file    Allergies: No Known Allergies  Past Medical History: Past Medical History:  Diagnosis Date   Alcohol abuse 05/20/2016   History of suicide attempt 05/20/2016   HIV antibody positive (HCC)    HIV disease (HCC) 01/15/2016   Multiple personality disorder (HCC) 05/20/2016    Social History: Social History   Socioeconomic History   Marital status: Single    Spouse name: Not on file   Number of children: Not on file   Years of education: Not on file   Highest education level: Not on file  Occupational History   Not on file  Tobacco Use   Smoking status: Never   Smokeless tobacco: Never  Substance and Sexual Activity   Alcohol use: Not Currently    Comment: socially   Drug use: Yes    Types: Marijuana    Sexual activity: Not Currently    Partners: Male    Comment: declined condoms  Other Topics Concern   Not on file  Social History Narrative   Not on file   Social Determinants of Health   Financial Resource Strain: Not on file  Food Insecurity: Not on file  Transportation Needs: Not on file  Physical Activity: Not on file  Stress: Not on file  Social Connections: Not on file    Labs: Lab Results  Component Value Date   HIV1RNAQUANT 29 (H) 05/03/2016   HIV1RNAQUANT <20 02/20/2016   HIV1RNAQUANT 10,500 (H) 01/15/2016   CD4TABS 430 05/03/2016   CD4TABS 470 02/20/2016   CD4TABS 620 01/15/2016    RPR and STI Lab Results  Component Value Date   LABRPR NON REAC 05/03/2016   LABRPR NON REAC 01/15/2016    STI Results GC CT  05/20/2016 Negative Negative    Hepatitis B Lab Results  Component Value Date   HEPBSAB POS (A) 01/15/2016   HEPBSAG NEGATIVE 01/15/2016   Hepatitis C No results found for: HEPCAB, HCVRNAPCRQN Hepatitis A No results found for: HAV Lipids: No results found for: CHOL, TRIG, HDL, CHOLHDL, VLDL, LDLCALC  Current HIV Regimen: Treatment naive  Assessment: Brenda Riley is here today to initiate care with [facility] for her HIV infection.  She is currently taking Biktarvy but does experience nausea with administration. She takes zofran with  her Biktarvy which seems to help. She misses about 2-3 doses per month due to "life happening". She tries to be better about it. She is interested in [place], so I went through our process for that. She is interested in the every 1 month injection. She will get labs today and see if she qualifies for [place]. She likes to have her medications mailed to her house, so we sent to [facility], and they will mail it out to her today.  Plan: - Continue Biktarvy for now - Transition to [place] eventually  Arsh Feutz L. Niyah Mamaril, PharmD, BCIDP, AAHIVP, CPP Clinical Pharmacist Practitioner Infectious  Diseases Clinical Pharmacist [facility] 03/08/2021, 12:14 PM

## 2021-03-08 NOTE — Assessment & Plan Note (Signed)
New patient here to transfer for HIV care.   Dx in 2013 with last VL undetectable and CD4 > 430. She is highly motivated to move to long acting injectables. Will check VL today and see where she is at regarding possibly switching. Reviewed previous ARVs and no resistance from what I have seen, though had some trouble with adherence on [place] in the remote past. Check pregnancy test today via blood work or with follow up urine at next OV with pap smear.   I discussed with [name] treatment options/side effects, benefits of treatment and long-term outcomes. I discussed how HIV is transmitted and the process of untreated HIV including increased risk for opportunistic infections, cancer, dementia and renal failure. Patient was counseled on routine HIV care including medication adherence, blood monitoring, necessary vaccines and follow up visits.   Will continue Biktarvy for HIV treatment - copay card provided. Will investigate with pharmacy re: cabenuva possibility.   General introduction to our clinic and integrated services.   Total clinic encounter time: 45 min

## 2021-03-08 NOTE — Assessment & Plan Note (Addendum)
Sounds like they are possibly related to anxiety/panic and hyperventilation. Diabetes screen today.  Will check folate/b12 today. Iron panel to follow if needed.

## 2021-03-12 LAB — B12 AND FOLATE PANEL
Folate: 13.6 ng/mL
Vitamin B-12: 395 pg/mL (ref 200–1100)

## 2021-03-12 LAB — QUANTIFERON-TB GOLD PLUS
Mitogen-NIL: >10 IU/mL
NIL: 0.05 IU/mL
QuantiFERON-TB Gold Plus: NEGATIVE
TB1-NIL: <0 IU/mL
TB2-NIL: <0 IU/mL

## 2021-03-12 LAB — CBC WITH DIFFERENTIAL/PLATELET
Absolute Monocytes: 464 cells/uL (ref 200–950)
Basophils Absolute: 29 cells/uL (ref 0–200)
Basophils Relative: 0.5 %
Eosinophils Absolute: 151 cells/uL (ref 15–500)
Eosinophils Relative: 2.6 %
HCT: 40.7 % (ref 35.0–45.0)
Hemoglobin: 12.8 g/dL (ref 11.7–15.5)
Lymphs Abs: 2842 cells/uL (ref 850–3900)
MCH: 27.9 pg (ref 27.0–33.0)
MCHC: 31.4 g/dL — ABNORMAL LOW (ref 32.0–36.0)
MCV: 88.7 fL (ref 80.0–100.0)
MPV: 11.8 fL (ref 7.5–12.5)
Monocytes Relative: 8 %
Neutro Abs: 2314 cells/uL (ref 1500–7800)
Neutrophils Relative %: 39.9 %
Platelets: 298 10*3/uL (ref 140–400)
RBC: 4.59 10*6/uL (ref 3.80–5.10)
RDW: 12.9 % (ref 11.0–15.0)
Total Lymphocyte: 49 %
WBC: 5.8 10*3/uL (ref 3.8–10.8)

## 2021-03-12 LAB — LIPID PANEL
Cholesterol: 148 mg/dL (ref <200)
HDL: 42 mg/dL — ABNORMAL LOW (ref >=50)
LDL Cholesterol (Calc): 80 mg/dL (calc)
Non-HDL Cholesterol (Calc): 106 mg/dL (calc) (ref <130)
Total CHOL/HDL Ratio: 3.5 (calc) (ref <5.0)
Triglycerides: 165 mg/dL — ABNORMAL HIGH (ref <150)

## 2021-03-12 LAB — COMPLETE METABOLIC PANEL WITH GFR
AG Ratio: 1.2 (calc) (ref 1.0–2.5)
ALT: 14 U/L (ref 6–29)
AST: 18 U/L (ref 10–30)
Albumin: 3.9 g/dL (ref 3.6–5.1)
Alkaline phosphatase (APISO): 59 U/L (ref 31–125)
BUN: 10 mg/dL (ref 7–25)
CO2: 26 mmol/L (ref 20–32)
Calcium: 8.9 mg/dL (ref 8.6–10.2)
Chloride: 106 mmol/L (ref 98–110)
Creat: 0.88 mg/dL (ref 0.50–0.97)
Globulin: 3.3 g/dL (calc) (ref 1.9–3.7)
Glucose, Bld: 91 mg/dL (ref 65–99)
Potassium: 4.1 mmol/L (ref 3.5–5.3)
Sodium: 138 mmol/L (ref 135–146)
Total Bilirubin: 0.3 mg/dL (ref 0.2–1.2)
Total Protein: 7.2 g/dL (ref 6.1–8.1)
eGFR: 90 mL/min/{1.73_m2} (ref >=60)

## 2021-03-12 LAB — RPR: RPR Ser Ql: NONREACTIVE

## 2021-03-12 LAB — HIV-1 RNA QUANT-NO REFLEX-BLD
HIV 1 RNA Quant: NOT DETECTED Copies/mL
HIV-1 RNA Quant, Log: NOT DETECTED Log cps/mL

## 2021-03-12 LAB — HEMOGLOBIN A1C
Hgb A1c MFr Bld: 5.5 % of total Hgb (ref <5.7)
Mean Plasma Glucose: 111 mg/dL
eAG (mmol/L): 6.2 mmol/L

## 2021-03-12 LAB — HCG, SERUM, QUALITATIVE: Preg, Serum: NEGATIVE

## 2021-03-13 ENCOUNTER — Other Ambulatory Visit (HOSPITAL_COMMUNITY): Payer: Self-pay

## 2021-03-29 ENCOUNTER — Ambulatory Visit (INDEPENDENT_AMBULATORY_CARE_PROVIDER_SITE_OTHER): Payer: BC Managed Care – PPO | Admitting: Pharmacist

## 2021-03-29 ENCOUNTER — Other Ambulatory Visit: Payer: Self-pay

## 2021-03-29 DIAGNOSIS — Z23 Encounter for immunization: Secondary | ICD-10-CM | POA: Diagnosis not present

## 2021-03-29 DIAGNOSIS — B20 Human immunodeficiency virus [HIV] disease: Secondary | ICD-10-CM

## 2021-03-29 MED ORDER — CABOTEGRAVIR & RILPIVIRINE ER 600 & 900 MG/3ML IM SUER
1.0000 | Freq: Once | INTRAMUSCULAR | Status: AC
  Administered 2021-03-29: 1 via INTRAMUSCULAR

## 2021-03-29 MED ORDER — CABOTEGRAVIR & RILPIVIRINE ER 600 & 900 MG/3ML IM SUER: 1.0000 | INTRAMUSCULAR | 0 refills | Status: DC

## 2021-03-29 NOTE — Progress Notes (Signed)
03/29/2021  HPI: Brenda Riley is a 30 y.o. female who presents to the [facility] clinic for HIV follow-up.  Patient Active Problem List   Diagnosis Date Noted   Paresthesia of both hands 03/08/2021   History of suicide attempt 05/20/2016   Severe bipolar I disorder, current or most recent episode depressed ([place]) 05/20/2016   Anxiety 01/15/2016   Neurosis, posttraumatic 01/15/2016   Severe episode of recurrent major depressive disorder ([place]) 12/19/2015   HIV disease ([place]) 12/18/2015   Suicidal behavior 12/18/2015   Nephropyelitis 05/15/2015    Patient's Medications  New Prescriptions   CABOTEGRAVIR & RILPIVIRINE ER (CABENUVA) 600 & 900 MG/3ML INJECTION    Inject 1 kit into the muscle every 2 (two) months.  Previous Medications   FLUOXETINE (PROZAC) 10 MG TABLET    Take 1 tablet (10 mg total) by mouth daily.   ONDANSETRON (ZOFRAN) 8 MG TABLET    Take 8 mg by mouth.  Modified Medications   No medications on file  Discontinued Medications   BICTEGRAVIR-EMTRICITABINE-TENOFOVIR AF (BIKTARVY) 50-200-25 MG TABS TABLET    Take 1 tablet by mouth daily.    Allergies: No Known Allergies  Past Medical History: Past Medical History:  Diagnosis Date   Alcohol abuse 05/20/2016   History of suicide attempt 05/20/2016   HIV antibody positive ([place])    HIV disease ([place]) 01/15/2016   Multiple personality disorder ([place]) 05/20/2016    Social History: Social History   Socioeconomic History   Marital status: Single    Spouse name: Not on file   Number of children: Not on file   Years of education: Not on file   Highest education level: Not on file  Occupational History   Not on file  Tobacco Use   Smoking status: Never   Smokeless tobacco: Never  Substance and Sexual Activity   Alcohol use: Not Currently    Comment: socially   Drug use: Yes    Types: Marijuana   Sexual activity: Not Currently    Partners: Male    Comment: declined condoms  Other Topics Concern   Not  on file  Social History Narrative   Not on file   Social Determinants of Health   Financial Resource Strain: Not on file  Food Insecurity: Not on file  Transportation Needs: Not on file  Physical Activity: Not on file  Stress: Not on file  Social Connections: Not on file    Labs: Lab Results  Component Value Date   HIV1RNAQUANT Not Detected 03/08/2021   HIV1RNAQUANT 29 (H) 05/03/2016   HIV1RNAQUANT <20 02/20/2016   CD4TABS 430 05/03/2016   CD4TABS 470 02/20/2016   CD4TABS 620 01/15/2016    RPR and STI Lab Results  Component Value Date   LABRPR NON-REACTIVE 03/08/2021   LABRPR NON REAC 05/03/2016   LABRPR NON REAC 01/15/2016    STI Results GC CT  05/20/2016 Negative Negative    Hepatitis B Lab Results  Component Value Date   HEPBSAB POS (A) 01/15/2016   HEPBSAG NEGATIVE 01/15/2016   Hepatitis C No results found for: HEPCAB, HCVRNAPCRQN Hepatitis A No results found for: HAV Lipids: Lab Results  Component Value Date   CHOL 148 03/08/2021   TRIG 165 (H) 03/08/2021   HDL 42 (L) 03/08/2021   CHOLHDL 3.5 03/08/2021   [place] 80 03/08/2021    Current HIV Regimen: Biktarvy  Target date: 22nd of each month   Assessment: Patient presented to clinic today for her first Cabenuva injection.  She states she took her last dose of Biktarvy today and was told she doesn't have to take [place] any longer now that she has received her first Cabenuva injection. She was counseled on the side effects of the medication, with soreness in the first week that typically will subside with subsequent injections. She was advised not to rub the site so the drug absorbs properly. She was also told she could use warm compresses to help with the soreness at injection sites. Patient was reminded that she will receive the next injection of Cabenuva in 1 month and then bimonthly thereafter.   Patient also opted to receive influenza vaccine in clinic today.   Plan: - Cabenuva 900/600 mg  IM x1 (F/U in 1 month for second dose- 04/26/21)  - Influenza vaccine IM x1  - F/U Hep A Ab at a future visit   Adria Dill, PharmD PGY-1 Acute Care Resident  03/29/2021 5:05 PM

## 2021-04-02 ENCOUNTER — Other Ambulatory Visit (HOSPITAL_COMMUNITY): Payer: Self-pay

## 2021-04-16 ENCOUNTER — Other Ambulatory Visit (HOSPITAL_COMMUNITY): Payer: Self-pay

## 2021-04-26 ENCOUNTER — Other Ambulatory Visit: Payer: Self-pay

## 2021-04-26 ENCOUNTER — Ambulatory Visit (INDEPENDENT_AMBULATORY_CARE_PROVIDER_SITE_OTHER): Payer: BC Managed Care – PPO | Admitting: Pharmacist

## 2021-04-26 DIAGNOSIS — B20 Human immunodeficiency virus [HIV] disease: Secondary | ICD-10-CM | POA: Diagnosis not present

## 2021-04-26 MED ORDER — CABOTEGRAVIR & RILPIVIRINE ER 600 & 900 MG/3ML IM SUER
1.0000 | Freq: Once | INTRAMUSCULAR | Status: AC
  Administered 2021-04-26: 1 via INTRAMUSCULAR

## 2021-04-26 NOTE — Progress Notes (Signed)
HPI: Brenda Riley is a 30 y.o. female who presents to the [facility] clinic for [place] administration.  Patient Active Problem List   Diagnosis Date Noted   Paresthesia of both hands 03/08/2021   History of suicide attempt 05/20/2016   Severe bipolar I disorder, current or most recent episode depressed ([place]) 05/20/2016   Anxiety 01/15/2016   Neurosis, posttraumatic 01/15/2016   Severe episode of recurrent major depressive disorder ([place]) 12/19/2015   HIV disease ([place]) 12/18/2015   Suicidal behavior 12/18/2015   Nephropyelitis 05/15/2015    Patient's Medications  New Prescriptions   No medications on file  Previous Medications   CABOTEGRAVIR & RILPIVIRINE ER (CABENUVA) 600 & 900 MG/3ML INJECTION    Inject 1 kit into the muscle every 2 (two) months.   FLUOXETINE (PROZAC) 10 MG TABLET    Take 1 tablet (10 mg total) by mouth daily.   ONDANSETRON (ZOFRAN) 8 MG TABLET    Take 8 mg by mouth.  Modified Medications   No medications on file  Discontinued Medications   No medications on file    Allergies: No Known Allergies  Past Medical History: Past Medical History:  Diagnosis Date   Alcohol abuse 05/20/2016   History of suicide attempt 05/20/2016   HIV antibody positive ([place])    HIV disease ([place]) 01/15/2016   Multiple personality disorder ([place]) 05/20/2016    Social History: Social History   Socioeconomic History   Marital status: Single    Spouse name: Not on file   Number of children: Not on file   Years of education: Not on file   Highest education level: Not on file  Occupational History   Not on file  Tobacco Use   Smoking status: Never   Smokeless tobacco: Never  Substance and Sexual Activity   Alcohol use: Not Currently    Comment: socially   Drug use: Yes    Types: Marijuana   Sexual activity: Not Currently    Partners: Male    Comment: declined condoms  Other Topics Concern   Not on file  Social History Narrative   Not on file   Social  Determinants of Health   Financial Resource Strain: Not on file  Food Insecurity: Not on file  Transportation Needs: Not on file  Physical Activity: Not on file  Stress: Not on file  Social Connections: Not on file    Labs: Lab Results  Component Value Date   HIV1RNAQUANT Not Detected 03/08/2021   HIV1RNAQUANT 29 (H) 05/03/2016   HIV1RNAQUANT <20 02/20/2016   CD4TABS 430 05/03/2016   CD4TABS 470 02/20/2016   CD4TABS 620 01/15/2016    RPR and STI Lab Results  Component Value Date   LABRPR NON-REACTIVE 03/08/2021   LABRPR NON REAC 05/03/2016   LABRPR NON REAC 01/15/2016    STI Results GC CT  05/20/2016 Negative Negative    Hepatitis B Lab Results  Component Value Date   HEPBSAB POS (A) 01/15/2016   HEPBSAG NEGATIVE 01/15/2016   Hepatitis C No results found for: HEPCAB, HCVRNAPCRQN Hepatitis A No results found for: HAV Lipids: Lab Results  Component Value Date   CHOL 148 03/08/2021   TRIG 165 (H) 03/08/2021   HDL 42 (L) 03/08/2021   CHOLHDL 3.5 03/08/2021   [place] 80 03/08/2021    TARGET DATE:  The 22nd of the month  Current HIV Regimen: Cabenuva  Assessment: Brenda Riley presents today for their maintenance Cabenuva injections. Initial/past injections were tolerated well without issues. No problems  with systemic effects of injections. Patient did experience mild knots that resolved in a couple of weeks.    Administered cabotegravir 652m/3mL in left upper outer quadrant of the gluteal muscle. Administered rilpivirine 900 mg/340min the right upper outer quadrant of the gluteal muscle. Monitored patient for 10 minutes after injection. Injections were tolerated well without issue. Patient will follow up in 2 months for next injection.  Patient deferred the COVID booster today but says she will think about it for next time. Will check HIV RNA and HAV antibody today.  Plan: - Cabenuva injections administered - Next injections scheduled for 12/22 with me  -  Check HIV RNA and HAV ab - Call with any issues or questions  AmAlfonse SprucePharmD, CPP Clinical Pharmacist Practitioner Infectious [facility]

## 2021-04-30 LAB — HIV-1 RNA QUANT-NO REFLEX-BLD
HIV 1 RNA Quant: NOT DETECTED Copies/mL
HIV-1 RNA Quant, Log: NOT DETECTED Log cps/mL

## 2021-04-30 LAB — HEPATITIS A ANTIBODY, TOTAL: Hepatitis A AB,Total: NONREACTIVE

## 2021-05-08 ENCOUNTER — Telehealth: Payer: Self-pay

## 2021-05-08 ENCOUNTER — Other Ambulatory Visit: Payer: Self-pay | Admitting: Infectious Diseases

## 2021-05-08 ENCOUNTER — Other Ambulatory Visit (HOSPITAL_COMMUNITY): Payer: Self-pay

## 2021-05-08 DIAGNOSIS — F314 Bipolar disorder, current episode depressed, severe, without psychotic features: Secondary | ICD-10-CM

## 2021-05-08 MED ORDER — FLUOXETINE HCL 10 MG PO TABS
10.0000 mg | ORAL_TABLET | Freq: Every day | ORAL | 1 refills | Status: DC
  Filled 2021-05-08: qty 30, 30d supply, fill #0
  Filled 2021-06-14: qty 30, 30d supply, fill #1

## 2021-05-08 NOTE — Telephone Encounter (Signed)
Please advise on refill.

## 2021-05-08 NOTE — Telephone Encounter (Signed)
Attempted to call patient after receiving refill request for Prozac. Per Durwin Nora, NP pt will need to either see pshyc regarding her anxiety/depression or follow up with her in December. Patient is not scheduled with [facility] due limited providers available. Will have patient follow up with [place], Np on 12/22 for cabenuva and follow up on antidepressants. Will send patient information on [facility] for counseling. Will also have her schedule appt with PCP to help manage her depression. Juanita Laster, RMA

## 2021-06-14 ENCOUNTER — Other Ambulatory Visit (HOSPITAL_COMMUNITY): Payer: Self-pay

## 2021-06-28 ENCOUNTER — Encounter (HOSPITAL_COMMUNITY): Payer: Self-pay | Admitting: *Deleted

## 2021-06-28 ENCOUNTER — Ambulatory Visit (HOSPITAL_COMMUNITY)
Admission: EM | Admit: 2021-06-28 | Discharge: 2021-06-28 | Disposition: A | Discharge status: Home / Self Care | Payer: BC Managed Care – PPO | Attending: Student | Admitting: Student

## 2021-06-28 ENCOUNTER — Encounter: Payer: BC Managed Care – PPO | Admitting: Pharmacist

## 2021-06-28 ENCOUNTER — Other Ambulatory Visit: Payer: Self-pay

## 2021-06-28 DIAGNOSIS — Z20822 Contact with and (suspected) exposure to covid-19: Secondary | ICD-10-CM | POA: Diagnosis not present

## 2021-06-28 DIAGNOSIS — Z21 Asymptomatic human immunodeficiency virus [HIV] infection status: Secondary | ICD-10-CM | POA: Diagnosis not present

## 2021-06-28 DIAGNOSIS — Z9851 Tubal ligation status: Secondary | ICD-10-CM | POA: Diagnosis not present

## 2021-06-28 DIAGNOSIS — B20 Human immunodeficiency virus [HIV] disease: Secondary | ICD-10-CM | POA: Diagnosis not present

## 2021-06-28 DIAGNOSIS — J069 Acute upper respiratory infection, unspecified: Secondary | ICD-10-CM | POA: Diagnosis not present

## 2021-06-28 DIAGNOSIS — Z79899 Other long term (current) drug therapy: Secondary | ICD-10-CM | POA: Diagnosis not present

## 2021-06-28 LAB — RESPIRATORY PANEL BY PCR

## 2021-06-28 LAB — SARS CORONAVIRUS 2 (TAT 6-24 HRS): SARS Coronavirus 2: NEGATIVE

## 2021-06-28 MED ORDER — PROMETHAZINE-DM 6.25-15 MG/5ML PO SYRP: 5.0000 mL | ORAL_SOLUTION | Freq: Four times a day (QID) | ORAL | 0 refills | Status: DC | PRN

## 2021-06-28 NOTE — ED Triage Notes (Signed)
Pt reports cold Sx's and HA for one week.

## 2021-06-28 NOTE — Discharge Instructions (Addendum)
-  Promethazine DM cough syrup for congestion/cough. This could make you drowsy, so take at night before bed. -You can take Tylenol up to 1000 mg 3 times daily, and ibuprofen up to 600 mg 3 times daily with food.  You can take these together, or alternate every 3-4 hours. -With a virus, you're typically contagious for 5-7 days, or as long as you're having fevers.

## 2021-06-28 NOTE — ED Provider Notes (Signed)
[facility]    CSN: [id number] Arrival date & time: 06/28/21  1028      History   Chief Complaint Chief Complaint  Patient presents with   Headache   Nasal Congestion    HPI Brenda Riley is a 30 y.o. female presenting with viral syndrome for about 1 week.  Medical history HIV disease, currently nondetectable.  Describes generalized body aches, fever/chills.  Has not monitor temperature at home.  Some cough and dyspnea on exertion.  Denies history of asthma/pulmonary disease.  Has attempted TheraFlu, honey, Robitussin, Tylenol with some improvement.  Denies known sick exposures, she works at home, though her kids do attend school.  Denies nausea, running, diarrhea.  History tubal ligation 20 years ago.   HPI  Past Medical History:  Diagnosis Date   Alcohol abuse 05/20/2016   History of suicide attempt 05/20/2016   HIV antibody positive ([place])    HIV disease ([place]) 01/15/2016   Multiple personality disorder ([place]) 05/20/2016    Patient Active Problem List   Diagnosis Date Noted   Paresthesia of both hands 03/08/2021   History of suicide attempt 05/20/2016   Severe bipolar I disorder, current or most recent episode depressed ([place]) 05/20/2016   Anxiety 01/15/2016   Neurosis, posttraumatic 01/15/2016   Severe episode of recurrent major depressive disorder ([place]) 12/19/2015   HIV disease ([place]) 12/18/2015   Suicidal behavior 12/18/2015   Nephropyelitis 05/15/2015    Past Surgical History:  Procedure Laterality Date   CESAREAN SECTION     DILATION AND CURETTAGE OF UTERUS      OB History     Gravida  1   Para      Term      Preterm      AB      Living         SAB      IAB      Ectopic      Multiple      Live Births               Home Medications    Prior to Admission medications   Medication Sig Start Date End Date Taking? Authorizing Provider  promethazine-dextromethorphan (PROMETHAZINE-DM) 6.25-15 MG/5ML syrup Take 5 mLs by  mouth 4 (four) times daily as needed for cough. 06/28/21  Yes Hazel Sams, PA-C  cabotegravir & rilpivirine ER (CABENUVA) 600 & 900 MG/3ML injection Inject 1 kit into the muscle every 2 (two) months. 03/29/21   Esmond Plants, RPH-CPP  FLUoxetine (PROZAC) 10 MG tablet Take 1 tablet (10 mg total) by mouth daily. 05/08/21   Cheswick Callas, NP  ondansetron (ZOFRAN) 8 MG tablet Take 8 mg by mouth. 07/14/20   [provider]    Family History History reviewed. No pertinent family history.  Social History Social History   Tobacco Use   Smoking status: Never   Smokeless tobacco: Never  Substance Use Topics   Alcohol use: Not Currently    Comment: socially   Drug use: Yes    Types: Marijuana     Allergies   Patient has no known allergies.   Review of Systems Review of Systems  Constitutional:  Positive for chills. Negative for appetite change and fever.  HENT:  Positive for congestion. Negative for ear pain, rhinorrhea, sinus pressure, sinus pain and sore throat.   Eyes:  Negative for redness and visual disturbance.  Respiratory:  Positive for cough. Negative for chest tightness, shortness of breath and  wheezing.   Cardiovascular:  Negative for chest pain and palpitations.  Gastrointestinal:  Negative for abdominal pain, constipation, diarrhea, nausea and vomiting.  Genitourinary:  Negative for dysuria, frequency and urgency.  Musculoskeletal:  Positive for myalgias.  Neurological:  Negative for dizziness, weakness and headaches.  Psychiatric/Behavioral:  Negative for confusion.   All other systems reviewed and are negative.   Physical Exam Triage Vital Signs ED Triage Vitals  Enc Vitals Group     BP 06/28/21 1044 114/81     Pulse Rate 06/28/21 1044 91     Resp 06/28/21 1044 20     Temp 06/28/21 1044 98.5 F (36.9 C)     Temp src --      SpO2 06/28/21 1044 98 %     Weight --      Height --      Head Circumference --      Peak Flow --      Pain Score  06/28/21 1042 0     Pain Loc --      Pain Edu? --      Excl. in [employer]? --    No data found.  Updated Vital Signs BP 114/81    Pulse 91    Temp 98.5 F (36.9 C)    Resp 20    LMP 06/11/2021    SpO2 98%   Visual Acuity Right Eye Distance:   Left Eye Distance:   Bilateral Distance:    Right Eye Near:   Left Eye Near:    Bilateral Near:     Physical Exam Vitals reviewed.  Constitutional:      General: She is not in acute distress.    Appearance: Normal appearance. She is ill-appearing.  HENT:     Head: Normocephalic and atraumatic.     Right Ear: Tympanic membrane, ear canal and external ear normal. No tenderness. No middle ear effusion. There is no impacted cerumen. Tympanic membrane is not perforated, erythematous, retracted or bulging.     Left Ear: Tympanic membrane, ear canal and external ear normal. No tenderness.  No middle ear effusion. There is no impacted cerumen. Tympanic membrane is not perforated, erythematous, retracted or bulging.     Nose: Nose normal. No congestion.     Mouth/Throat:     Mouth: Mucous membranes are moist.     Pharynx: Uvula midline. No oropharyngeal exudate or posterior oropharyngeal erythema.  Eyes:     Extraocular Movements: Extraocular movements intact.     Pupils: Pupils are equal, round, and reactive to light.  Cardiovascular:     Rate and Rhythm: Normal rate and regular rhythm.     Heart sounds: Normal heart sounds.  Pulmonary:     Effort: Pulmonary effort is normal.     Breath sounds: Normal breath sounds. No decreased breath sounds, wheezing, rhonchi or rales.  Abdominal:     Palpations: Abdomen is soft.     Tenderness: There is no abdominal tenderness. There is no guarding or rebound.  Lymphadenopathy:     Cervical: No cervical adenopathy.     Right cervical: No superficial cervical adenopathy.    Left cervical: No superficial cervical adenopathy.  Neurological:     General: No focal deficit present.     Mental Status: She is  alert and oriented to person, place, and time.  Psychiatric:        Mood and Affect: Mood normal.        Behavior: Behavior normal.  Thought Content: Thought content normal.        Judgment: Judgment normal.     UC Treatments / Results  Labs (all labs ordered are listed, but only abnormal results are displayed) Labs Reviewed  SARS CORONAVIRUS 2 (TAT 6-24 HRS)  RESPIRATORY PANEL BY PCR    EKG   Radiology No results found.  Procedures Procedures (including critical care time)  Medications Ordered in UC Medications - No data to display  Initial Impression / Assessment and Plan / UC Course  I have reviewed the triage vital signs and the nursing notes.  Pertinent labs & imaging results that were available during my care of the patient were reviewed by me and considered in my medical decision making (see chart for details).     This patient is a very pleasant 30 y.o. year old female presenting with viral URI with cough x1 week. Today this pt is afebrile nontachycardic nontachypneic, oxygenating well on room air, no wheezes rhonchi or rales.   Covid PCR sent; respiratory panel sent including influenza.  She is out of antiviral and tamiflu windows.   Promethazine DM sent, continue OTC medications.  HIV disease - nondetectable 10/22, continue to follow with infectious disease.   Final Clinical Impressions(s) / UC Diagnoses   Final diagnoses:  Viral URI with cough  History of tubal ligation  HIV disease ([place])     Discharge Instructions      -Promethazine DM cough syrup for congestion/cough. This could make you drowsy, so take at night before bed. -You can take Tylenol up to 1000 mg 3 times daily, and ibuprofen up to 600 mg 3 times daily with food.  You can take these together, or alternate every 3-4 hours. -With a virus, you're typically contagious for 5-7 days, or as long as you're having fevers.       ED Prescriptions     Medication Sig Dispense Auth.  Provider   promethazine-dextromethorphan (PROMETHAZINE-DM) 6.25-15 MG/5ML syrup Take 5 mLs by mouth 4 (four) times daily as needed for cough. 118 mL Hazel Sams, PA-C      PDMP not reviewed this encounter.   Hazel Sams, PA-C 06/28/21 1106

## 2021-06-29 ENCOUNTER — Encounter: Payer: BC Managed Care – PPO | Admitting: Pharmacist

## 2021-07-04 ENCOUNTER — Ambulatory Visit (INDEPENDENT_AMBULATORY_CARE_PROVIDER_SITE_OTHER): Payer: BC Managed Care – PPO | Admitting: Pharmacist

## 2021-07-04 ENCOUNTER — Other Ambulatory Visit: Payer: Self-pay

## 2021-07-04 DIAGNOSIS — B20 Human immunodeficiency virus [HIV] disease: Secondary | ICD-10-CM | POA: Diagnosis not present

## 2021-07-04 MED ORDER — CABOTEGRAVIR & RILPIVIRINE ER 600 & 900 MG/3ML IM SUER
1.0000 | Freq: Once | INTRAMUSCULAR | Status: AC
  Administered 2021-07-04: 09:00:00 1 via INTRAMUSCULAR

## 2021-07-04 NOTE — Progress Notes (Signed)
HPI: Brenda Riley is a 30 y.o. female who presents to the [facility] for [place] administration.  Patient Active Problem List   Diagnosis Date Noted   Paresthesia of both hands 03/08/2021   History of suicide attempt 05/20/2016   Severe bipolar I disorder, current or most recent episode depressed ([place]) 05/20/2016   Anxiety 01/15/2016   Neurosis, posttraumatic 01/15/2016   Severe episode of recurrent major depressive disorder ([place]) 12/19/2015   HIV disease ([place]) 12/18/2015   Suicidal behavior 12/18/2015   Nephropyelitis 05/15/2015    Patient's Medications  New Prescriptions   No medications on file  Previous Medications   CABOTEGRAVIR & RILPIVIRINE ER (CABENUVA) 600 & 900 MG/3ML INJECTION    Inject 1 kit into the muscle every 2 (two) months.   FLUOXETINE (PROZAC) 10 MG TABLET    Take 1 tablet (10 mg total) by mouth daily.   ONDANSETRON (ZOFRAN) 8 MG TABLET    Take 8 mg by mouth.   PROMETHAZINE-DEXTROMETHORPHAN (PROMETHAZINE-DM) 6.25-15 MG/5ML SYRUP    Take 5 mLs by mouth 4 (four) times daily as needed for cough.  Modified Medications   No medications on file  Discontinued Medications   No medications on file    Allergies: No Known Allergies  Past Medical History: Past Medical History:  Diagnosis Date   Alcohol abuse 05/20/2016   History of suicide attempt 05/20/2016   HIV antibody positive ([place])    HIV disease ([place]) 01/15/2016   Multiple personality disorder ([place]) 05/20/2016    Social History: Social History   Socioeconomic History   Marital status: Single    Spouse name: Not on file   Number of children: Not on file   Years of education: Not on file   Highest education level: Not on file  Occupational History   Not on file  Tobacco Use   Smoking status: Never   Smokeless tobacco: Never  Substance and Sexual Activity   Alcohol use: Not Currently    Comment: socially   Drug use: Yes    Types: Marijuana   Sexual activity: Not Currently     Partners: Male    Comment: declined condoms  Other Topics Concern   Not on file  Social History Narrative   Not on file   Social Determinants of Health   Financial Resource Strain: Not on file  Food Insecurity: Not on file  Transportation Needs: Not on file  Physical Activity: Not on file  Stress: Not on file  Social Connections: Not on file    Labs: Lab Results  Component Value Date   HIV1RNAQUANT Not Detected 04/26/2021   HIV1RNAQUANT Not Detected 03/08/2021   HIV1RNAQUANT 29 (H) 05/03/2016   CD4TABS 430 05/03/2016   CD4TABS 470 02/20/2016   CD4TABS 620 01/15/2016    RPR and STI Lab Results  Component Value Date   LABRPR NON-REACTIVE 03/08/2021   LABRPR NON REAC 05/03/2016   LABRPR NON REAC 01/15/2016    STI Results GC CT  05/20/2016 Negative Negative    Hepatitis B Lab Results  Component Value Date   HEPBSAB POS (A) 01/15/2016   HEPBSAG NEGATIVE 01/15/2016   Hepatitis C No results found for: HEPCAB, HCVRNAPCRQN Hepatitis A Lab Results  Component Value Date   HAV NON-REACTIVE 04/26/2021   Lipids: Lab Results  Component Value Date   CHOL 148 03/08/2021   TRIG 165 (H) 03/08/2021   HDL 42 (L) 03/08/2021   CHOLHDL 3.5 03/08/2021   [place] 80 03/08/2021  TARGET DATE: The 22nd of the month  Assessment: Brenda Riley presents today for their maintenance Cabenuva injections. Initial/past injections were tolerated well without issues. No problems with systemic side effects of injections. Patient did experience mild pain after injections.   Administered cabotegravir 653m/3mL in left upper outer quadrant of the gluteal muscle. Administered rilpivirine 900 mg/366min the right upper outer quadrant of the gluteal muscle. Monitored patient for 10 minutes after injection. Injections were tolerated well without issue. Patient will follow up in 2 months for next set of injections.  Of [facility] when her next set of injections are due, so made an additional appointment in January for her. Patient was late for work today so she refused a lab draw.   Plan: - Cabenuva injections administered - Next injections scheduled for 2/22 with me - F/[facility] 1/12 at 845am - Call with any issues or questions  Caia Lofaro L. Ranyah Groeneveld, PharmD, BCIDP, AAHIVP, CPMelvernlinical Pharmacist Practitioner [place] Infectious Disease

## 2021-07-19 ENCOUNTER — Ambulatory Visit: Payer: BC Managed Care – PPO | Admitting: Infectious Diseases

## 2021-08-03 ENCOUNTER — Other Ambulatory Visit: Payer: Self-pay | Admitting: Infectious Diseases

## 2021-08-03 DIAGNOSIS — F314 Bipolar disorder, current episode depressed, severe, without psychotic features: Secondary | ICD-10-CM

## 2021-08-07 MED ORDER — FLUOXETINE HCL 10 MG PO TABS
10.0000 mg | ORAL_TABLET | Freq: Every day | ORAL | 1 refills | Status: DC
  Filled 2021-08-07: qty 30, 30d supply, fill #0
  Filled 2021-09-15 – 2021-10-25 (×2): qty 30, 30d supply, fill #1

## 2021-08-08 ENCOUNTER — Other Ambulatory Visit (HOSPITAL_COMMUNITY): Payer: Self-pay

## 2021-08-14 ENCOUNTER — Other Ambulatory Visit (HOSPITAL_COMMUNITY): Payer: Self-pay

## 2021-08-29 ENCOUNTER — Ambulatory Visit (INDEPENDENT_AMBULATORY_CARE_PROVIDER_SITE_OTHER): Payer: BC Managed Care – PPO | Admitting: Pharmacist

## 2021-08-29 ENCOUNTER — Other Ambulatory Visit: Payer: Self-pay

## 2021-08-29 DIAGNOSIS — B20 Human immunodeficiency virus [HIV] disease: Secondary | ICD-10-CM | POA: Diagnosis not present

## 2021-08-29 DIAGNOSIS — Z79899 Other long term (current) drug therapy: Secondary | ICD-10-CM | POA: Diagnosis not present

## 2021-08-29 DIAGNOSIS — Z113 Encounter for screening for infections with a predominantly sexual mode of transmission: Secondary | ICD-10-CM | POA: Diagnosis not present

## 2021-08-29 MED ORDER — CABOTEGRAVIR & RILPIVIRINE ER 600 & 900 MG/3ML IM SUER
1.0000 | Freq: Once | INTRAMUSCULAR | Status: AC
  Administered 2021-08-29: 1 via INTRAMUSCULAR

## 2021-08-29 NOTE — Progress Notes (Signed)
HPI: Brenda Riley is a 31 y.o. female who presents to the [facility] for [place] administration.  Patient Active Problem List   Diagnosis Date Noted   Paresthesia of both hands 03/08/2021   History of suicide attempt 05/20/2016   Severe bipolar I disorder, current or most recent episode depressed ([place]) 05/20/2016   Anxiety 01/15/2016   Neurosis, posttraumatic 01/15/2016   Severe episode of recurrent major depressive disorder ([place]) 12/19/2015   HIV disease ([place]) 12/18/2015   Suicidal behavior 12/18/2015   Nephropyelitis 05/15/2015    Patient's Medications  New Prescriptions   No medications on file  Previous Medications   CABOTEGRAVIR & RILPIVIRINE ER (CABENUVA) 600 & 900 MG/3ML INJECTION    Inject 1 kit into the muscle every 2 (two) months.   FLUOXETINE (PROZAC) 10 MG TABLET    Take 1 tablet (10 mg total) by mouth daily.   ONDANSETRON (ZOFRAN) 8 MG TABLET    Take 8 mg by mouth.   PROMETHAZINE-DEXTROMETHORPHAN (PROMETHAZINE-DM) 6.25-15 MG/5ML SYRUP    Take 5 mLs by mouth 4 (four) times daily as needed for cough.  Modified Medications   No medications on file  Discontinued Medications   No medications on file    Allergies: No Known Allergies  Past Medical History: Past Medical History:  Diagnosis Date   Alcohol abuse 05/20/2016   History of suicide attempt 05/20/2016   HIV antibody positive ([place])    HIV disease ([place]) 01/15/2016   Multiple personality disorder ([place]) 05/20/2016    Social History: Social History   Socioeconomic History   Marital status: Single    Spouse name: Not on file   Number of children: Not on file   Years of education: Not on file   Highest education level: Not on file  Occupational History   Not on file  Tobacco Use   Smoking status: Never   Smokeless tobacco: Never  Substance and Sexual Activity   Alcohol use: Not Currently    Comment: socially   Drug use: Yes    Types: Marijuana   Sexual activity: Not Currently     Partners: Male    Comment: declined condoms  Other Topics Concern   Not on file  Social History Narrative   Not on file   Social Determinants of Health   Financial Resource Strain: Not on file  Food Insecurity: Not on file  Transportation Needs: Not on file  Physical Activity: Not on file  Stress: Not on file  Social Connections: Not on file    Labs: Lab Results  Component Value Date   HIV1RNAQUANT Not Detected 04/26/2021   HIV1RNAQUANT Not Detected 03/08/2021   HIV1RNAQUANT 29 (H) 05/03/2016   CD4TABS 430 05/03/2016   CD4TABS 470 02/20/2016   CD4TABS 620 01/15/2016    RPR and STI Lab Results  Component Value Date   LABRPR NON-REACTIVE 03/08/2021   LABRPR NON REAC 05/03/2016   LABRPR NON REAC 01/15/2016    STI Results GC CT  05/20/2016 Negative Negative    Hepatitis B Lab Results  Component Value Date   HEPBSAB POS (A) 01/15/2016   HEPBSAG NEGATIVE 01/15/2016   Hepatitis C No results found for: HEPCAB, HCVRNAPCRQN Hepatitis A Lab Results  Component Value Date   HAV NON-REACTIVE 04/26/2021   Lipids: Lab Results  Component Value Date   CHOL 148 03/08/2021   TRIG 165 (H) 03/08/2021   HDL 42 (L) 03/08/2021   CHOLHDL 3.5 03/08/2021   [place] 80 03/08/2021  TARGET DATE: The 22nd of the month  Assessment: Brenda Riley presents today for her maintenance Cabenuva injections. She has experienced pain with the injections but it is getting better the more she has. She also developed nodules but they are also getting better with time. No other issues or complaints. She is due for a follow up with Colletta Maryland. Colletta Maryland is not in clinic the 2 weeks around her next target date so will make an extra appointment for her to be seen. She states that she missed her appointment last month due to not being able to get off work.   Administered cabotegravir 625m/3mL in left upper outer quadrant of the gluteal muscle. Administered rilpivirine 900 mg/340min the right upper  outer quadrant of the gluteal muscle. Monitored patient for 10 minutes after injection. Injections were tolerated well without issue. Patient will follow up in 2 months for next set of injections. Will check routine lab work [facility].  Plan: - Cabenuva injections administered - Next injections scheduled for 4/24 at 845am with me - B20 f/u [facility] 4/6 at 9am - HIV RNA, CD4, RPR, lipid panel, CMET, CBC w diff, and urine cytology today - Call with any issues or questions  Shahrzad Koble L. Lakeidra Reliford, PharmD, BCIDP, AAHIVP, CPZimmermanlinical Pharmacist Practitioner [facility] [place] Infectious Disease

## 2021-08-31 LAB — CBC WITH DIFFERENTIAL/PLATELET
Absolute Monocytes: 270 cells/uL (ref 200–950)
Basophils Absolute: 31 cells/uL (ref 0–200)
Basophils Relative: 0.6 %
Eosinophils Absolute: 51 cells/uL (ref 15–500)
Eosinophils Relative: 1 %
HCT: 39.1 % (ref 35.0–45.0)
Hemoglobin: 12.8 g/dL (ref 11.7–15.5)
Lymphs Abs: 2326 cells/uL (ref 850–3900)
MCH: 28.5 pg (ref 27.0–33.0)
MCHC: 32.7 g/dL (ref 32.0–36.0)
MCV: 87.1 fL (ref 80.0–100.0)
MPV: 11.5 fL (ref 7.5–12.5)
Monocytes Relative: 5.3 %
Neutro Abs: 2423 cells/uL (ref 1500–7800)
Neutrophils Relative %: 47.5 %
Platelets: 298 10*3/uL (ref 140–400)
RBC: 4.49 10*6/uL (ref 3.80–5.10)
RDW: 12.9 % (ref 11.0–15.0)
Total Lymphocyte: 45.6 %
WBC: 5.1 10*3/uL (ref 3.8–10.8)

## 2021-08-31 LAB — LIPID PANEL
Cholesterol: 175 mg/dL (ref <200)
HDL: 45 mg/dL — ABNORMAL LOW (ref >=50)
LDL Cholesterol (Calc): 113 mg/dL (calc) — ABNORMAL HIGH
Non-HDL Cholesterol (Calc): 130 mg/dL (calc) — ABNORMAL HIGH (ref <130)
Total CHOL/HDL Ratio: 3.9 (calc) (ref <5.0)
Triglycerides: 74 mg/dL (ref <150)

## 2021-08-31 LAB — COMPREHENSIVE METABOLIC PANEL
AG Ratio: 1.3 (calc) (ref 1.0–2.5)
ALT: 12 U/L (ref 6–29)
AST: 14 U/L (ref 10–30)
Albumin: 4 g/dL (ref 3.6–5.1)
Alkaline phosphatase (APISO): 64 U/L (ref 31–125)
BUN: 11 mg/dL (ref 7–25)
CO2: 28 mmol/L (ref 20–32)
Calcium: 9.5 mg/dL (ref 8.6–10.2)
Chloride: 105 mmol/L (ref 98–110)
Creat: 0.93 mg/dL (ref 0.50–0.97)
Globulin: 3.2 g/dL (calc) (ref 1.9–3.7)
Glucose, Bld: 94 mg/dL (ref 65–99)
Potassium: 4.2 mmol/L (ref 3.5–5.3)
Sodium: 139 mmol/L (ref 135–146)
Total Bilirubin: 0.3 mg/dL (ref 0.2–1.2)
Total Protein: 7.2 g/dL (ref 6.1–8.1)

## 2021-08-31 LAB — HIV-1 RNA QUANT-NO REFLEX-BLD
HIV 1 RNA Quant: NOT DETECTED Copies/mL
HIV-1 RNA Quant, Log: NOT DETECTED Log cps/mL

## 2021-08-31 LAB — T-HELPER CELLS (CD4) COUNT (NOT AT ARMC)
Absolute CD4: 886 cells/uL (ref 490–1740)
CD4 T Helper %: 40 % (ref 30–61)
Total lymphocyte count: 2189 cells/uL (ref 850–3900)

## 2021-08-31 LAB — C. TRACHOMATIS/N. GONORRHOEAE RNA
C. trachomatis RNA, TMA: NOT DETECTED
N. gonorrhoeae RNA, TMA: NOT DETECTED

## 2021-08-31 LAB — RPR: RPR Ser Ql: NONREACTIVE

## 2021-09-05 ENCOUNTER — Encounter (HOSPITAL_COMMUNITY): Payer: Self-pay

## 2021-09-05 ENCOUNTER — Ambulatory Visit (INDEPENDENT_AMBULATORY_CARE_PROVIDER_SITE_OTHER)
Admission: EM | Admit: 2021-09-05 | Discharge: 2021-09-05 | Disposition: A | Discharge status: Home / Self Care | Payer: BC Managed Care – PPO | Source: Home / Self Care | Attending: Family Medicine | Admitting: Family Medicine

## 2021-09-05 ENCOUNTER — Emergency Department (HOSPITAL_COMMUNITY): Payer: BC Managed Care – PPO

## 2021-09-05 ENCOUNTER — Emergency Department (HOSPITAL_COMMUNITY)
Admission: EM | Admit: 2021-09-05 | Discharge: 2021-09-05 | Disposition: A | Discharge status: Home / Self Care | Payer: BC Managed Care – PPO | Attending: Emergency Medicine | Admitting: Emergency Medicine

## 2021-09-05 ENCOUNTER — Other Ambulatory Visit: Payer: Self-pay

## 2021-09-05 DIAGNOSIS — N760 Acute vaginitis: Secondary | ICD-10-CM | POA: Diagnosis not present

## 2021-09-05 DIAGNOSIS — B9689 Other specified bacterial agents as the cause of diseases classified elsewhere: Secondary | ICD-10-CM

## 2021-09-05 DIAGNOSIS — R102 Pelvic and perineal pain: Secondary | ICD-10-CM | POA: Insufficient documentation

## 2021-09-05 DIAGNOSIS — N939 Abnormal uterine and vaginal bleeding, unspecified: Secondary | ICD-10-CM | POA: Diagnosis not present

## 2021-09-05 LAB — URINALYSIS, ROUTINE W REFLEX MICROSCOPIC
Bilirubin Urine: NEGATIVE
Glucose, UA: NEGATIVE mg/dL
Hgb urine dipstick: NEGATIVE
Ketones, ur: NEGATIVE mg/dL
Leukocytes,Ua: NEGATIVE
Nitrite: NEGATIVE
Protein, ur: NEGATIVE mg/dL
Specific Gravity, Urine: 1.02 (ref 1.005–1.030)
pH: 5 (ref 5.0–8.0)

## 2021-09-05 LAB — CBC WITH DIFFERENTIAL/PLATELET
Abs Immature Granulocytes: 0.01 10*3/uL (ref 0.00–0.07)
Basophils Absolute: 0 10*3/uL (ref 0.0–0.1)
Basophils Relative: 0 %
Eosinophils Absolute: 0.1 10*3/uL (ref 0.0–0.5)
Eosinophils Relative: 2 %
HCT: 38.2 % (ref 36.0–46.0)
Hemoglobin: 12.3 g/dL (ref 12.0–15.0)
Immature Granulocytes: 0 %
Lymphocytes Relative: 51 %
Lymphs Abs: 2.7 10*3/uL (ref 0.7–4.0)
MCH: 28.7 pg (ref 26.0–34.0)
MCHC: 32.2 g/dL (ref 30.0–36.0)
MCV: 89.3 fL (ref 80.0–100.0)
Monocytes Absolute: 0.4 10*3/uL (ref 0.1–1.0)
Monocytes Relative: 7 %
Neutro Abs: 2.1 10*3/uL (ref 1.7–7.7)
Neutrophils Relative %: 40 %
Platelets: 284 10*3/uL (ref 150–400)
RBC: 4.28 MIL/uL (ref 3.87–5.11)
RDW: 13.2 % (ref 11.5–15.5)
WBC: 5.3 10*3/uL (ref 4.0–10.5)
nRBC: 0 % (ref 0.0–0.2)

## 2021-09-05 LAB — COMPREHENSIVE METABOLIC PANEL
ALT: 19 U/L (ref 0–44)
AST: 20 U/L (ref 15–41)
Albumin: 3.4 g/dL — ABNORMAL LOW (ref 3.5–5.0)
Alkaline Phosphatase: 57 U/L (ref 38–126)
Anion gap: 8 (ref 5–15)
BUN: 8 mg/dL (ref 6–20)
CO2: 25 mmol/L (ref 22–32)
Calcium: 9.2 mg/dL (ref 8.9–10.3)
Chloride: 108 mmol/L (ref 98–111)
Creatinine, Ser: 1.01 mg/dL — ABNORMAL HIGH (ref 0.44–1.00)
GFR, Estimated: >60 mL/min (ref >60)
Glucose, Bld: 97 mg/dL (ref 70–99)
Potassium: 4.4 mmol/L (ref 3.5–5.1)
Sodium: 141 mmol/L (ref 135–145)
Total Bilirubin: 0.1 mg/dL — ABNORMAL LOW (ref 0.3–1.2)
Total Protein: 7.3 g/dL (ref 6.5–8.1)

## 2021-09-05 LAB — WET PREP, GENITAL
Sperm: NONE SEEN
Trich, Wet Prep: NONE SEEN
WBC, Wet Prep HPF POC: <10 (ref <10)
Yeast Wet Prep HPF POC: NONE SEEN

## 2021-09-05 LAB — PREGNANCY, URINE: Preg Test, Ur: NEGATIVE

## 2021-09-05 LAB — POCT URINALYSIS DIPSTICK, ED / UC
Bilirubin Urine: NEGATIVE
Glucose, UA: NEGATIVE mg/dL
Hgb urine dipstick: NEGATIVE
Ketones, ur: NEGATIVE mg/dL
Leukocytes,Ua: NEGATIVE
Nitrite: NEGATIVE
Protein, ur: NEGATIVE mg/dL
Specific Gravity, Urine: >=1.03 (ref 1.005–1.030)
Urobilinogen, UA: 1 mg/dL (ref 0.0–1.0)
pH: 5.5 (ref 5.0–8.0)

## 2021-09-05 LAB — POC URINE PREG, ED: Preg Test, Ur: NEGATIVE

## 2021-09-05 MED ORDER — METRONIDAZOLE 500 MG PO TABS: 500.0000 mg | ORAL_TABLET | Freq: Two times a day (BID) | ORAL | 0 refills | Status: DC

## 2021-09-05 NOTE — ED Provider Notes (Signed)
[facility] EMERGENCY DEPARTMENT Provider Note   CSN: [id number] Arrival date & time: 09/05/21  1108     History Chief Complaint  Patient presents with   Vaginal Bleeding   Abdominal Cramping    Brenda Riley is a 31 y.o. female with h/o HIV presents to the ED for evaluation of mild vaginal spotting for the past three days. Denies any trauma to the area.  She reports her last period started around 2 weeks ago and lasted its typical course.  She said there is nothing atypical about her last period.  She denies any abnormal periods.  She does not remember the last time she had a Pap smear or saw an OB/GYN, but has an appointment with them in April.  Additionally, she is mentioning some intermittent lower abdominal cramping that is mild in nature and "feels like period cramps".  She is also complaining of some pelvic pain as well.  She was sent from urgent care to rule out ovarian torsion or cyst.  She denies any lightheadedness, abnormal vaginal discharge, dysuria, or hematuria.  She denies any nausea, vomiting, diarrhea, or constipation.  Denies any chest pain or shortness of breath.  Vaginal Bleeding Associated symptoms: abdominal pain   Associated symptoms: no dysuria, no fatigue, no fever, no nausea and no vaginal discharge   Abdominal Cramping Associated symptoms include abdominal pain. Pertinent negatives include no chest pain and no shortness of breath.      Home Medications Prior to Admission medications   Medication Sig Start Date End Date Taking? Authorizing Provider  cabotegravir & rilpivirine ER (CABENUVA) 600 & 900 MG/3ML injection Inject 1 kit into the muscle every 2 (two) months. 03/29/21   Esmond Plants, RPH-CPP  FLUoxetine (PROZAC) 10 MG tablet Take 1 tablet (10 mg total) by mouth daily. 08/07/21   Rabun Callas, NP  ondansetron (ZOFRAN) 8 MG tablet Take 8 mg by mouth. 07/14/20   [provider]  promethazine-dextromethorphan  (PROMETHAZINE-DM) 6.25-15 MG/5ML syrup Take 5 mLs by mouth 4 (four) times daily as needed for cough. 06/28/21   Hazel Sams, PA-C      Allergies    Patient has no known allergies.    Review of Systems   Review of Systems  Constitutional:  Negative for chills, fatigue and fever.  Respiratory:  Negative for cough and shortness of breath.   Cardiovascular:  Negative for chest pain.  Gastrointestinal:  Positive for abdominal pain. Negative for constipation, diarrhea, nausea and vomiting.  Genitourinary:  Positive for pelvic pain and vaginal bleeding. Negative for dysuria, flank pain, hematuria and vaginal discharge.  Musculoskeletal:  Negative for myalgias.   Physical Exam Updated Vital Signs BP 123/65    Pulse (!) 54    Temp 98.3 F (36.8 C) (Oral)    Resp 16    LMP 08/30/2021 (Exact Date)    SpO2 98%  Physical Exam Vitals and nursing note reviewed. Exam conducted with a chaperone present Lysbeth Galas, RN).  Constitutional:      General: She is not in acute distress.    Appearance: Normal appearance. She is not toxic-appearing.  HENT:     Head: Normocephalic and atraumatic.     Mouth/Throat:     Mouth: Mucous membranes are moist.  Eyes:     General: No scleral icterus. Cardiovascular:     Rate and Rhythm: Normal rate and regular rhythm.  Pulmonary:     Effort: Pulmonary effort is normal.     Breath sounds:  Normal breath sounds.  Abdominal:     General: Bowel sounds are normal.     Palpations: Abdomen is soft.     Tenderness: There is abdominal tenderness. There is no right CVA tenderness, left CVA tenderness, guarding or rebound.     Comments: Abdominal exam limited secondary to body habitus. Normoactive bowel sounds. Abdomen soft. Mild lower suprapubic tenderness to palpation. No guarding or rebound noted. No overlying skin changes noted.   Genitourinary:    Exam position: Lithotomy position.     Labia:        Right: No rash, tenderness or lesion.        Left: No rash,  tenderness or lesion.      Urethra: No prolapse.     Vagina: Vaginal discharge and bleeding present. No erythema or lesions.     Cervix: Discharge and cervical bleeding present. No cervical motion tenderness, friability, lesion or erythema.     Comments: Small amount of thin serosanginous mucus discharge noted in the vaginal canal. No CMT. No lesions visualized. Small amount of this fluid exiting the cervical os.  Musculoskeletal:        General: No deformity.     Cervical back: Normal range of motion.  Skin:    General: Skin is warm and dry.  Neurological:     General: No focal deficit present.     Mental Status: She is alert. Mental status is at baseline.    ED Results / Procedures / Treatments   Labs (all labs ordered are listed, but only abnormal results are displayed) Labs Reviewed  COMPREHENSIVE METABOLIC PANEL - Abnormal; Notable for the following components:      Result Value   Creatinine, Ser 1.01 (*)    Albumin 3.4 (*)    Total Bilirubin 0.1 (*)    All other components within normal limits  URINALYSIS, ROUTINE W REFLEX MICROSCOPIC - Abnormal; Notable for the following components:   APPearance HAZY (*)    All other components within normal limits  WET PREP, GENITAL  CBC WITH DIFFERENTIAL/PLATELET  PREGNANCY, URINE  POC URINE PREG, ED  GC/CHLAMYDIA PROBE AMP ([place]) NOT AT [facility]    EKG None  Radiology US PELVIC COMPLETE W TRANSVAGINAL AND TORSION R/O  Result Date: 09/05/2021 CLINICAL DATA:  Vaginal bleeding, LMP 08/25/2020, G3P2A1 EXAM: TRANSABDOMINAL AND TRANSVAGINAL ULTRASOUND OF PELVIS DOPPLER ULTRASOUND OF OVARIES TECHNIQUE: Both transabdominal and transvaginal ultrasound examinations of the pelvis were performed. Transabdominal technique was performed for global imaging of the pelvis including uterus, ovaries, adnexal regions, and pelvic cul-de-sac. It was necessary to proceed with endovaginal exam following the transabdominal exam to visualize the uterus,  endometrium, and ovaries. Color and duplex Doppler ultrasound was utilized to evaluate blood flow to the ovaries. COMPARISON:  None FINDINGS: Uterus Measurements: 7.9 x 3.7 x 4.0 cm = volume: 61 mL. Anteverted. Normal morphology without mass Endometrium Thickness: 7 mm. No endometrial fluid or mass. Small amount of nonspecific fluid within endocervical canal. Right ovary Measurements: 2.9 x 1.7 x 2.0 cm = volume: 5.1 mL. Normal morphology without mass. Internal blood flow present on color Doppler imaging. Left ovary Measurements: 3.0 x 1.9 x 2.1 cm = volume: 6.1 mL. Normal morphology without mass Pulsed Doppler evaluation of both ovaries demonstrates low resistance arterial waveforms in both ovaries. A venous waveform was seen within LEFT ovary but not obtained in the RIGHT ovary. Other findings Trace free pelvic fluid.  No adnexal masses. IMPRESSION: Small amount of nonspecific fluid within endocervical canal.  Otherwise negative exam. Electronically Signed   By: Lavonia Dana M.D.   On: 09/05/2021 13:42    Procedures Procedures   Medications Ordered in ED Medications - No data to display  ED Course/ Medical Decision Making/ A&P                           Medical Decision Making Amount and/or Complexity of Data Reviewed Labs: ordered. Radiology: ordered. ECG/medicine tests: ordered.  Risk Prescription drug management.  31 year old female presents emergency department for evaluation of three days of vaginal spotting with lower abdominal cramping. Differential diagnosis includes but is not limited to abnormal uterine bleeding, uterine fibroid, ovarian torsion.  Vital signs show some bradycardia, otherwise normal.  Normotensive, afebrile, satting well on room air without any increased work of breathing.  Physical exam is pertinent for some very mild lower suprapubic tenderness palpation.  Her GU exam showed some serosanguineous mucus discharge present in the vagina with a small amount exiting the  cervical os.  No CMT.  No lesions.  No cervical friability or erythema.  No vaginal lesions seen as well.  Labs and imaging ordered.  I independently reviewed and interpreted the patient's labs and imaging and agree with radiologist interpretation.  Ultrasound shows small amount of nonspecific fluid within the endocervical canal otherwise negative exam.  There was venous waveform seen with the left ovary but not obtained in the right ovary.  There was pulsed Doppler evaluation of both ovaries that demonstrated low resistance arterial waveforms in both ovaries though.  Urinalysis showed hazy urine, otherwise no nitrites, leukocytes, ketones, or bilirubin seen.  No blood seen either.  Pregnancy test negative.  CBC shows no anemia or leukocytosis.  CMP shows elevated creatinine at 1.01 although this is slightly elevated from her baseline.  Decreased albumin at 3.4.  Decreased total bili at 0.1.  No other electrolyte abnormality.  Wet prep shows clue cells with less than 10 white blood cells seen.  No yeast or trichomoniasis seen.  GC pending.  Patient reports that she has a very low suspicion that she has any STDs and declines treatment for them at this time.  I do not think the patient's bacterial vaginosis is causing this atypical vaginal spotting. The patient is not complaining of any vaginal discharge abnormality. Given the recent studies on increase in PID from [place], will treat.  At this time, I think this is abnormal uterine bleeding that will need further work-up with OB/GYN outpatient.  There were no uterine fibroids seen on ultrasound. She is not anemic from the vaginal spotting. She is not having any lightheadedness. I have low suspicion for any ovarian torsion given the patient's presentation, ultrasound, well appearance, and physical exam.  Discussed imaging findings with my attending who reports that the patient is safe for discharge with GYN follow up.   I discussed the lab and imaging findings  with the patient. We discussed that the she will need to keep the appointment in April to follow up with her OBGYN. We discussed BV treatment with Metronidazole and to abstain from alcohol use with this.  Discussed with her that if her gonorrhea and/or chlamydia is positive, someone will call her in some medication and let her know her results.  Strict return precautions discussed. Patient verbalizes understanding and agrees to the plan. Requesting a work note for a few days. The patient is stable and being discharge home in good condition.    Final Clinical  Impression(s) / ED Diagnoses Final diagnoses:  Vaginal bleeding  BV (bacterial vaginosis)  Abnormal uterine bleeding    Rx / DC Orders ED Discharge Orders          Ordered    metroNIDAZOLE (FLAGYL) 500 MG tablet  2 times daily        09/05/21 1518              Sherrell Puller, PA-C 09/05/21 1552    Daleen Bo, MD 09/06/21 [phone]

## 2021-09-05 NOTE — ED Provider Triage Note (Signed)
Emergency Medicine Provider Triage Evaluation Note ? ?[name] , a 31 y.o. female  was evaluated in triage.  Pt with h/o HIV complains of vaginal bleeding/spotting after her period with lower abdominal cramping and pelvic pain. Seen in UC and sent over to rule out ovarian torsion  ? ?Review of Systems  ?Positive: Vaginal bleeding, pelvic pain, abdominal pain  ?Negative: Nausea, vomiting, fever ? ?Physical Exam  ?BP 123/65   Pulse (!) 54   Temp 98.3 ?F (36.8 ?C) (Oral)   Resp 16   LMP 08/30/2021 (Exact Date)   SpO2 98%  ?Gen:   Awake, no distress   ?Resp:  Normal effort  ?MSK:   Moves extremities without difficulty  ?Other:  NBS. Soft. Lower abdominal tenderness to palpation without guarding or rebound.  ? ?Medical Decision Making  ?Medically screening exam initiated at 11:45 AM.  Appropriate orders placed.  SHATARA STANEK was informed that the remainder of the evaluation will be completed by another provider, this initial triage assessment does not replace that evaluation, and the importance of remaining in the ED until their evaluation is complete. ? ?Labs and [place] ordered.  ?  ?Achille Rich, PA-C ?09/05/21 1146 ? ?

## 2021-09-05 NOTE — ED Provider Notes (Signed)
?[place] ? ? ?[id number] ?09/05/21 Arrival Time: [id number] ? ?ASSESSMENT & PLAN: ? ?1. Pelvic pain in female   ? ?Given amount of pain she describes, I cannot r/o intra-abdominal process including ovarian torsion or cyst. Low back pain sounds MSK in nature. Afebrile. VSS. Discussed. She prefers ED evaluation. To ED via private vehicle. Stable upon discharge. Declines Toradol here. ? ?Vaginal cytology sent. ? ?Results for orders placed or performed during the hospital encounter of 09/05/21  ?POC Urinalysis dipstick  ?Result Value Ref Range  ? Glucose, UA NEGATIVE NEGATIVE mg/dL  ? Bilirubin Urine NEGATIVE NEGATIVE  ? Ketones, ur NEGATIVE NEGATIVE mg/dL  ? Specific Gravity, Urine >=1.030 1.005 - 1.030  ? Hgb urine dipstick NEGATIVE NEGATIVE  ? pH 5.5 5.0 - 8.0  ? Protein, ur NEGATIVE NEGATIVE mg/dL  ? Urobilinogen, UA 1.0 0.0 - 1.0 mg/dL  ? Nitrite NEGATIVE NEGATIVE  ? Leukocytes,Ua NEGATIVE NEGATIVE  ?POC urine pregnancy  ?Result Value Ref Range  ? Preg Test, Ur NEGATIVE NEGATIVE  ? ? ? Follow-up Information   ? ? Go to  [facility].   ?Specialty: Emergency Medicine ?Contact information: ?[address] ?[id number] mc ?[place] ?[phone] ? ?  ?  ? ?  ?  ? ?  ? ? ?Reviewed expectations re: course of current medical issues. Questions answered. ?Outlined signs and symptoms indicating need for more acute intervention. ?Patient verbalized understanding. ?After Visit Summary given. ? ? ?SUBJECTIVE: ?History from: patient. ?Brenda Riley is a 31 y.o. female who presents with complaint of lower abdominal discomfort, mostly left-sided; gradual onset x 3 days. No abd trauma. No LMP recorded. ?She reports frequent menstrual periods with spotting in between periods. Reports currently spotting. No specific vaginal discharge. Is sexually active with single female partner. Afebrile. Mild nausea without emesis. No diarrhea; normal BM this  morning. ?Also reports left lower back pain into buttock; noted over a few days; worse from sitting to standing. No extremity sensation changes or weakness.  ?No tx PTA. ? ?Past Surgical History:  ?Procedure Laterality Date  ? CESAREAN SECTION    ? DILATION AND CURETTAGE OF UTERUS    ? ?OBJECTIVE: ? ?Vitals:  ? 09/05/21 1016  ?BP: (!) 139/99  ?Pulse: 67  ?Resp: 16  ?Temp: 98.2 ?F (36.8 ?C)  ?TempSrc: Oral  ?SpO2: 99%  ?  ?General appearance: alert, oriented, no acute distress but appears uncomfortable when moving to exam table ?HEENT: [place]; AT; oropharynx moist ?Lungs: unlabored respirations ?Abdomen: soft; without distention; moderate and poorly localized tenderness to palpation over lower mid to left abdomen ; without masses or organomegaly; without frank guarding or rebound tenderness but does pull away to deep palpation over left lower abdomen ?GU: deferred here; going to ED ?Back: without reported CVA tenderness; FROM at waist ?Extremities: without LE edema; symmetrical; without gross deformities ?Skin: warm and dry ?Neurologic: normal gait ?Psychological: alert and cooperative; normal mood and affect ? ?Labs: ?Results for orders placed or performed during the hospital encounter of 09/05/21  ?POC Urinalysis dipstick  ?Result Value Ref Range  ? Glucose, UA NEGATIVE NEGATIVE mg/dL  ? Bilirubin Urine NEGATIVE NEGATIVE  ? Ketones, ur NEGATIVE NEGATIVE mg/dL  ? Specific Gravity, Urine >=1.030 1.005 - 1.030  ? Hgb urine dipstick NEGATIVE NEGATIVE  ? pH 5.5 5.0 - 8.0  ? Protein, ur NEGATIVE NEGATIVE mg/dL  ? Urobilinogen, UA 1.0 0.0 - 1.0 mg/dL  ? Nitrite NEGATIVE NEGATIVE  ? Leukocytes,Ua NEGATIVE NEGATIVE  ?  POC urine pregnancy  ?Result Value Ref Range  ? Preg Test, Ur NEGATIVE NEGATIVE  ? ?Labs Reviewed  ?POCT URINALYSIS DIPSTICK, ED / UC  ?POC URINE PREG, ED  ?CERVICOVAGINAL ANCILLARY ONLY  ? ? ?No Known Allergies ?                                            ?Past Medical History:  ?Diagnosis Date  ? Alcohol abuse  05/20/2016  ? History of suicide attempt 05/20/2016  ? HIV antibody positive ([place])   ? HIV disease ([place]) 01/15/2016  ? Multiple personality disorder ([place]) 05/20/2016  ? ? ?Social History  ? ?Socioeconomic History  ? Marital status: Significant Other  ?  Spouse name: Not on file  ? Number of children: Not on file  ? Years of education: Not on file  ? Highest education level: Not on file  ?Occupational History  ? Not on file  ?Tobacco Use  ? Smoking status: Never  ? Smokeless tobacco: Never  ?Substance and Sexual Activity  ? Alcohol use: Not Currently  ?  Comment: socially  ? Drug use: Yes  ?  Types: Marijuana  ? Sexual activity: Not Currently  ?  Partners: Male  ?  Comment: declined condoms  ?Other Topics Concern  ? Not on file  ?Social History Narrative  ? Not on file  ? ?Social Determinants of Health  ? ?Financial Resource Strain: Not on file  ?Food Insecurity: Not on file  ?Transportation Needs: Not on file  ?Physical Activity: Not on file  ?Stress: Not on file  ?Social Connections: Not on file  ?Intimate Partner Violence: Not on file  ? ? ?History reviewed. No pertinent family history. ?  Vanessa Kick, MD ?09/05/21 1117 ? ?

## 2021-09-05 NOTE — ED Triage Notes (Signed)
Pt sent from UC for eval of three days of menstrual cramps and spotting. Last period ended on Thursday.  ?

## 2021-09-05 NOTE — Discharge Instructions (Addendum)
You were seen here today for evaluation of you vaginal spotting. Your ultrasound was normal as well as your lab work. Your wet prep, did show signs of bacterial vaginosis. For this, I have prescribed you an antibiotic called Metronidazole. DO NOT DRINK ALCOHOL WHILE ON THIS MEDICATION.  ? ?Contact a doctor if: ?The bleeding lasts more than one week. ?You feel dizzy at times. ?You feel like you may vomit (nausea). ?You vomit. ?You feel light-headed or weak. ?Your symptoms get worse. ? ?Get help right away if: ?You faint. ?You have to change pads every hour. ?You have pain in your belly. ?You have a fever or chills. ?You get sweaty or weak. ?You pass large blood clots from your vagina. ?

## 2021-09-05 NOTE — ED Triage Notes (Signed)
Pt presents for pelvic pain for several days. She does reports some spotting in between her periods.  ?

## 2021-09-06 LAB — CERVICOVAGINAL ANCILLARY ONLY
Bacterial Vaginitis (gardnerella): POSITIVE — AB
Candida Glabrata: NEGATIVE
Candida Vaginitis: NEGATIVE
Chlamydia: NEGATIVE
Comment: NEGATIVE
Comment: NEGATIVE
Comment: NEGATIVE
Comment: NEGATIVE
Comment: NEGATIVE
Comment: NORMAL
Neisseria Gonorrhea: NEGATIVE
Trichomonas: NEGATIVE

## 2021-09-06 LAB — GC/CHLAMYDIA PROBE AMP (~~LOC~~) NOT AT ARMC
Chlamydia: NEGATIVE
Comment: NEGATIVE
Comment: NORMAL
Neisseria Gonorrhea: NEGATIVE

## 2021-09-15 ENCOUNTER — Other Ambulatory Visit (HOSPITAL_COMMUNITY): Payer: Self-pay

## 2021-09-17 ENCOUNTER — Other Ambulatory Visit (HOSPITAL_COMMUNITY): Payer: Self-pay

## 2021-09-21 ENCOUNTER — Other Ambulatory Visit (HOSPITAL_COMMUNITY): Payer: Self-pay

## 2021-10-11 ENCOUNTER — Telehealth: Payer: Self-pay

## 2021-10-11 ENCOUNTER — Ambulatory Visit: Payer: BC Managed Care – PPO | Admitting: Infectious Diseases

## 2021-10-11 DIAGNOSIS — R102 Pelvic and perineal pain: Secondary | ICD-10-CM | POA: Insufficient documentation

## 2021-10-11 NOTE — Telephone Encounter (Signed)
Called patient to see if she would be able to make it to today's appointment, no answer. Left HIPAA compliant voicemail requesting callback.   Dominiqua Cooner D Roshonda Sperl, RN  

## 2021-10-11 NOTE — Progress Notes (Deleted)
? ?Name: Brenda Riley  ?DOB: [birth year] ?MRN: [id number] ?PCP: Patient, No Pcp Per (Inactive)  ? ? ?Brief Narrative:  ?ADRA SHEPLER is a 31 y.o. female with HIV, Dx 09-2012. Previously working with Dr. Tommy Medal here at [facility] in 2017, in care with [facility] since then. LOV at [facility] ID was 02/2020 - VL undetectable at that time ?CD4 nadir > 200 reported ?HIV Risk: heterosexual contact ?History of OIs: none ?Intake Labs: ?Hep B sAg (-), sAb (+) 2017, Hep A (), Hep C () ?Quantiferon () ?HLA B*5701 () ?G6PD: () ? ? ?Previous Regimens: ?2014 - kaletra + combivir  ?Stribild ?Tivicay + Truvada 2017 ? ?Genotypes: ?None on file currently.  ? ?Subjective:  ? ?CC: ?HIV follow up care  ?*** ?  ? ?HPI: ?Started on injectable cabenuva on 03/29/2021. Her viral loads have remained undetectable since that time with last level assessed 08/29/21.  ? ?Depression?  ? ?Seen in the UC and ER for evaluation of pelvic pain and vaginal bleeding - sent to hospital for r/o of ovarian cyst. Ultrasound did not reveal any acute process; mentioned some obscure non specific fluid in endocervical canal. She has an appointment with GYN scheduled in April on ***.  ? ? ? ?  03/08/2021  ? 10:47 AM  ?Depression screen PHQ 2/9  ?Decreased Interest 3  ?PHQ - 2 Score 3  ? ? ? ?ROS ? ? ? ?Past Medical History:  ?Diagnosis Date  ? Alcohol abuse 05/20/2016  ? History of suicide attempt 05/20/2016  ? HIV antibody positive ([place])   ? HIV disease ([place]) 01/15/2016  ? Multiple personality disorder ([place]) 05/20/2016  ? ? ?Outpatient Medications Prior to Visit  ?Medication Sig Dispense Refill  ? cabotegravir & rilpivirine ER (CABENUVA) 600 & 900 MG/3ML injection Inject 1 kit into the muscle every 2 (two) months. 6 mL 0  ? FLUoxetine (PROZAC) 10 MG tablet Take 1 tablet (10 mg total) by mouth daily. 30 tablet 1  ? metroNIDAZOLE (FLAGYL) 500 MG tablet Take 1 tablet (500 mg total) by mouth 2 (two) times daily. 14 tablet 0  ? ondansetron (ZOFRAN) 8 MG tablet Take 8 mg by  mouth.    ? promethazine-dextromethorphan (PROMETHAZINE-DM) 6.25-15 MG/5ML syrup Take 5 mLs by mouth 4 (four) times daily as needed for cough. 118 mL 0  ? ?No facility-administered medications prior to visit.  ?  ? ?No Known Allergies ? ?Social History  ? ?Tobacco Use  ? Smoking status: Never  ? Smokeless tobacco: Never  ?Substance Use Topics  ? Alcohol use: Not Currently  ?  Comment: socially  ? Drug use: Yes  ?  Types: Marijuana  ? ? ?No family history on file. ? ?Social History  ? ?Substance and Sexual Activity  ?Sexual Activity Not Currently  ? Partners: Male  ? Comment: declined condoms  ? ? ? ?Objective:  ? ?There were no vitals filed for this visit. ? ?There is no height or weight on file to calculate BMI. ? ?Physical Exam ? ?Lab Results ?Lab Results  ?Component Value Date  ? WBC 5.3 09/05/2021  ? HGB 12.3 09/05/2021  ? HCT 38.2 09/05/2021  ? MCV 89.3 09/05/2021  ? PLT 284 09/05/2021  ?  ?Lab Results  ?Component Value Date  ? CREATININE 1.01 (H) 09/05/2021  ? BUN 8 09/05/2021  ? NA 141 09/05/2021  ? K 4.4 09/05/2021  ? CL 108 09/05/2021  ? CO2 25 09/05/2021  ?  ?Lab Results  ?Component Value  Date  ? ALT 19 09/05/2021  ? AST 20 09/05/2021  ? ALKPHOS 57 09/05/2021  ? BILITOT 0.1 (L) 09/05/2021  ?  ?Lab Results  ?Component Value Date  ? CHOL 175 08/29/2021  ? HDL 45 (L) 08/29/2021  ? LDLCALC 113 (H) 08/29/2021  ? TRIG 74 08/29/2021  ? CHOLHDL 3.9 08/29/2021  ? ?HIV 1 RNA Quant (Copies/mL)  ?Date Value  ?08/29/2021 Not Detected  ?04/26/2021 Not Detected  ?03/08/2021 Not Detected  ? ?CD4 T Cell Abs (/uL)  ?Date Value  ?05/03/2016 430  ?02/20/2016 470  ?01/15/2016 620  ? ? ? ?Assessment & Plan:  ? ?Problem List Items Addressed This Visit   ?None ? ?Will plan to at least touch base with her virtually in 4 weeks to follow up on prozac re-initiation. If we can transition to cabenuva injectables we will arrange schedule appropriately. She is interested in monthly dosing regimen.  ? ? ?Janene Madeira, MSN,  NP-C ?[facility] ?[facility] ?Pager: [phone] ?Office: [phone] ? ?10/11/21  ?8:33 AM ? ? ?

## 2021-10-25 ENCOUNTER — Other Ambulatory Visit (HOSPITAL_COMMUNITY): Payer: Self-pay

## 2021-10-29 ENCOUNTER — Ambulatory Visit (INDEPENDENT_AMBULATORY_CARE_PROVIDER_SITE_OTHER): Payer: BC Managed Care – PPO | Admitting: Pharmacist

## 2021-10-29 ENCOUNTER — Other Ambulatory Visit: Payer: Self-pay

## 2021-10-29 DIAGNOSIS — B20 Human immunodeficiency virus [HIV] disease: Secondary | ICD-10-CM | POA: Diagnosis not present

## 2021-10-29 DIAGNOSIS — Z23 Encounter for immunization: Secondary | ICD-10-CM | POA: Diagnosis not present

## 2021-10-29 DIAGNOSIS — Z113 Encounter for screening for infections with a predominantly sexual mode of transmission: Secondary | ICD-10-CM

## 2021-10-29 MED ORDER — CABOTEGRAVIR & RILPIVIRINE ER 600 & 900 MG/3ML IM SUER
1.0000 | Freq: Once | INTRAMUSCULAR | Status: AC
  Administered 2021-10-29: 1 via INTRAMUSCULAR

## 2021-10-29 NOTE — Progress Notes (Signed)
? ?HPI: Brenda Riley is a 31 y.o. female who presents to the [facility] for [place] administration. ? ?Patient Active Problem List  ? Diagnosis Date Noted  ? Pelvic pain 10/11/2021  ? Paresthesia of both hands 03/08/2021  ? History of suicide attempt 05/20/2016  ? Severe bipolar I disorder, current or most recent episode depressed ([place]) 05/20/2016  ? Anxiety 01/15/2016  ? Neurosis, posttraumatic 01/15/2016  ? Severe episode of recurrent major depressive disorder ([place]) 12/19/2015  ? HIV disease ([place]) 12/18/2015  ? Suicidal behavior 12/18/2015  ? Nephropyelitis 05/15/2015  ? ? ?Patient's Medications  ?New Prescriptions  ? No medications on file  ?Previous Medications  ? CABOTEGRAVIR & RILPIVIRINE ER (CABENUVA) 600 & 900 MG/3ML INJECTION    Inject 1 kit into the muscle every 2 (two) months.  ? FLUOXETINE (PROZAC) 10 MG TABLET    Take 1 tablet (10 mg total) by mouth daily.  ? METRONIDAZOLE (FLAGYL) 500 MG TABLET    Take 1 tablet (500 mg total) by mouth 2 (two) times daily.  ? ONDANSETRON (ZOFRAN) 8 MG TABLET    Take 8 mg by mouth.  ? PROMETHAZINE-DEXTROMETHORPHAN (PROMETHAZINE-DM) 6.25-15 MG/5ML SYRUP    Take 5 mLs by mouth 4 (four) times daily as needed for cough.  ?Modified Medications  ? No medications on file  ?Discontinued Medications  ? No medications on file  ? ? ?Allergies: ?No Known Allergies ? ?Past Medical History: ?Past Medical History:  ?Diagnosis Date  ? Alcohol abuse 05/20/2016  ? History of suicide attempt 05/20/2016  ? HIV antibody positive ([place])   ? HIV disease ([place]) 01/15/2016  ? Multiple personality disorder ([place]) 05/20/2016  ? ? ?Social History: ?Social History  ? ?Socioeconomic History  ? Marital status: Significant Other  ?  Spouse name: Not on file  ? Number of children: Not on file  ? Years of education: Not on file  ? Highest education level: Not on file  ?Occupational History  ? Not on file  ?Tobacco Use  ? Smoking status: Never  ? Smokeless tobacco: Never  ?Substance and  Sexual Activity  ? Alcohol use: Not Currently  ?  Comment: socially  ? Drug use: Yes  ?  Types: Marijuana  ? Sexual activity: Not Currently  ?  Partners: Male  ?  Comment: declined condoms  ?Other Topics Concern  ? Not on file  ?Social History Narrative  ? Not on file  ? ?Social Determinants of Health  ? ?Financial Resource Strain: Not on file  ?Food Insecurity: Not on file  ?Transportation Needs: Not on file  ?Physical Activity: Not on file  ?Stress: Not on file  ?Social Connections: Not on file  ? ? ?Labs: ?Lab Results  ?Component Value Date  ? HIV1RNAQUANT Not Detected 08/29/2021  ? HIV1RNAQUANT Not Detected 04/26/2021  ? HIV1RNAQUANT Not Detected 03/08/2021  ? CD4TABS 430 05/03/2016  ? CD4TABS 470 02/20/2016  ? CD4TABS 620 01/15/2016  ? ? ?RPR and STI ?Lab Results  ?Component Value Date  ? LABRPR NON-REACTIVE 08/29/2021  ? LABRPR NON-REACTIVE 03/08/2021  ? LABRPR NON REAC 05/03/2016  ? LABRPR NON REAC 01/15/2016  ? ? ?STI Results GC CT  ?09/05/2021 ? 2:40 PM Negative   Negative    ?09/05/2021 ?10:25 AM Negative   Negative    ?05/20/2016 ?12:00 AM Negative   Negative    ? ? ?Hepatitis B ?Lab Results  ?Component Value Date  ? HEPBSAB POS (A) 01/15/2016  ? HEPBSAG NEGATIVE 01/15/2016  ? ?  Hepatitis C ?No results found for: [place], HCVRNAPCRQN ?Hepatitis A ?Lab Results  ?Component Value Date  ? HAV NON-REACTIVE 04/26/2021  ? ?Lipids: ?Lab Results  ?Component Value Date  ? CHOL 175 08/29/2021  ? TRIG 74 08/29/2021  ? HDL 45 (L) 08/29/2021  ? CHOLHDL 3.9 08/29/2021  ? LDLCALC 113 (H) 08/29/2021  ? ? ?TARGET DATE: ?22nd of the month ? ?Assessment: ?[name] presents today for their maintenance Cabenuva injections. Past injections were tolerated well without issues. No problems with systemic side effects of injections. Patient has been experiencing irregular menstrual cycles and asks if this could be due to Cabenuva injections. Discussed with patient how it is unlikely to be related to the injections. Patient has an OBGYN  appointment in May and will follow up with doctor.  ? ?Patient reports no new sexual partners since last visit. She would like to be tested for syphilis today, but defers urine/swabs today. Will check RPR today.  ? ?Patient is due for Prevnar 20 vaccine, Hep A vaccine, and HPV vaccine. Patient would like to get HPV vaccine today. Will administer HPV 1/3 today, next doses to be given with follow-up appointments ? ?Administered cabotegravir 641m/3mL in left upper outer quadrant of the gluteal muscle. Administered rilpivirine 900 mg/336min the right upper outer quadrant of the gluteal muscle. Injections were tolerated well. Patient will follow up in 2 months for next set of injections. Will check HIV RNA at next injection.  ? ? ?Plan: ?- Cabenuva injections administered ?- HPV 1/3 given today ?- Check RPR today ?- Next injections scheduled for 6/27 9am with [place] [place]8/16 8:45am with Cassie ?- HPV 2/3 due in 2 months (June), HPV 3/3 due in 6 months (October) ?- Follow up on Prevnar 20 and Hep A vaccines at future visits ?- Call with any issues or questions ? ?VaMarlowe AltPharmD Candidate ?10/29/2021 9:52 AM ? ? ?

## 2021-10-30 LAB — RPR: RPR Ser Ql: NONREACTIVE

## 2021-11-21 ENCOUNTER — Encounter: Payer: Self-pay | Admitting: Nurse Practitioner

## 2021-11-30 ENCOUNTER — Other Ambulatory Visit: Payer: Self-pay | Admitting: Infectious Diseases

## 2021-11-30 ENCOUNTER — Other Ambulatory Visit (HOSPITAL_COMMUNITY): Payer: Self-pay

## 2021-11-30 DIAGNOSIS — F314 Bipolar disorder, current episode depressed, severe, without psychotic features: Secondary | ICD-10-CM

## 2021-11-30 MED ORDER — FLUOXETINE HCL 10 MG PO TABS
10.0000 mg | ORAL_TABLET | Freq: Every day | ORAL | 0 refills | Status: DC
  Filled 2021-11-30: qty 30, 30d supply, fill #0

## 2021-11-30 NOTE — Telephone Encounter (Signed)
Seems she has been filling q51m roughly. Will revill x 1 month and reassess

## 2021-11-30 NOTE — Telephone Encounter (Signed)
Ok to refill Prozac?

## 2021-11-30 NOTE — Telephone Encounter (Signed)
Please advise on rx.

## 2021-12-18 ENCOUNTER — Encounter: Payer: Self-pay | Admitting: Nurse Practitioner

## 2021-12-18 DIAGNOSIS — Z0289 Encounter for other administrative examinations: Secondary | ICD-10-CM

## 2021-12-18 NOTE — Progress Notes (Deleted)
   Acute Office Visit  Subjective:    Patient ID: Brenda Riley, female    DOB: 02/17/1991, 31 y.o.   MRN: [id number]   HPI 31 y.o. presents as new patient for pelvic pain. Normal pelvic ultrasound 09/2021 in ER.    Review of Systems     Objective:    Physical Exam  There were no vitals taken for this visit. Wt Readings from Last 3 Encounters:  03/08/21 226 lb 6.4 oz (102.7 kg)  05/20/16 198 lb (89.8 kg)  03/04/16 191 lb (86.6 kg)        Patient informed chaperone available to be present for breast and/or pelvic exam. Patient has requested no chaperone to be present. Patient has been advised what will be completed during breast and pelvic exam.   Assessment & Plan:   Problem List Items Addressed This Visit   None       Olivia Mackie DNP, 11:41 AM 12/18/2021

## 2021-12-23 ENCOUNTER — Other Ambulatory Visit: Payer: Self-pay | Admitting: Infectious Diseases

## 2021-12-23 DIAGNOSIS — F314 Bipolar disorder, current episode depressed, severe, without psychotic features: Secondary | ICD-10-CM

## 2021-12-25 ENCOUNTER — Other Ambulatory Visit (HOSPITAL_COMMUNITY): Payer: Self-pay

## 2022-01-01 ENCOUNTER — Other Ambulatory Visit: Payer: Self-pay

## 2022-01-01 ENCOUNTER — Encounter: Payer: Self-pay | Admitting: Infectious Diseases

## 2022-01-01 ENCOUNTER — Other Ambulatory Visit: Payer: Self-pay | Admitting: Pharmacist

## 2022-01-01 ENCOUNTER — Other Ambulatory Visit (HOSPITAL_COMMUNITY): Payer: Self-pay

## 2022-01-01 ENCOUNTER — Ambulatory Visit (INDEPENDENT_AMBULATORY_CARE_PROVIDER_SITE_OTHER): Payer: BC Managed Care – PPO | Admitting: Infectious Diseases

## 2022-01-01 VITALS — BP 138/79 | HR 57 | Temp 97.7°F | Wt 200.0 lb

## 2022-01-01 DIAGNOSIS — B20 Human immunodeficiency virus [HIV] disease: Secondary | ICD-10-CM | POA: Diagnosis not present

## 2022-01-01 DIAGNOSIS — Z0000 Encounter for general adult medical examination without abnormal findings: Secondary | ICD-10-CM | POA: Insufficient documentation

## 2022-01-01 DIAGNOSIS — F314 Bipolar disorder, current episode depressed, severe, without psychotic features: Secondary | ICD-10-CM

## 2022-01-01 DIAGNOSIS — Z23 Encounter for immunization: Secondary | ICD-10-CM | POA: Diagnosis not present

## 2022-01-01 DIAGNOSIS — F332 Major depressive disorder, recurrent severe without psychotic features: Secondary | ICD-10-CM

## 2022-01-01 MED ORDER — CABOTEGRAVIR & RILPIVIRINE ER 600 & 900 MG/3ML IM SUER
1.0000 | INTRAMUSCULAR | 5 refills | Status: DC
  Filled 2022-01-01: qty 6, 60d supply, fill #0

## 2022-01-01 MED ORDER — FLUOXETINE HCL 10 MG PO TABS: 10.0000 mg | ORAL_TABLET | Freq: Every day | ORAL | 5 refills | Status: DC

## 2022-01-01 MED ORDER — CABOTEGRAVIR & RILPIVIRINE ER 600 & 900 MG/3ML IM SUER
1.0000 | Freq: Once | INTRAMUSCULAR | Status: AC
  Administered 2022-01-01: 1 via INTRAMUSCULAR

## 2022-01-01 MED ORDER — CABOTEGRAVIR & RILPIVIRINE ER 600 & 900 MG/3ML IM SUER: 1.0000 | INTRAMUSCULAR | 5 refills | Status: DC

## 2022-01-01 NOTE — Progress Notes (Signed)
Name: Brenda Riley  DOB: 09-29-1990 MRN: [id number] PCP: Patient, No Pcp Per    Brief Narrative:  PAIYTON DICKERMAN is a 31 y.o. female with HIV, Dx 09-2012 CD4 nadir > 200 reported HIV Risk: heterosexual contact History of OIs: none Intake Labs: Hep B sAg (-), sAb (+) 2017, Hep A (), Hep C () Quantiferon () HLA B*5701 () G6PD: ()   Previous Regimens: 2014 - kaletra + combivir  Stribild Tivicay + Truvada 2017 Cabenuva 04-2021  Genotypes: None on file currently.   Subjective:   Chief Complaint  Patient presents with   Follow-up    b20     HPI: Doing pretty well with Cabenuva injections. She does get some nodules that form in the muscle but they don't bother her to the point where. Viral loads have been undetectable on treatment with last level February 2023. She does have some itching at these sites   Depression - has not worsened on Cabenuva from what she can tell. Has been taking Prozac everyday. She has noticed a huge difference. Feels like this has really evened her out and helped immensely. Has noticed some nausea when taken without food. Denies any SI.   ER visits since Last OV with me for abnormal uterine bleeding and pelvic pain. STI screenings negative at this encounter - did have clue cells on wet prep indicating bacterial vaginitis. She is due for a pap smear. She has very very bad cramps. She has had heavy bleeding in between periods. No chance of pregnancy - s/p BTL.    Flowsheet Row Office Visit from 01/01/2022 in [facility] for Infectious Disease  PHQ-2 Total Score 0         Upstream - 01/01/22 7829       Pregnancy Intention Screening   Does the patient want to become pregnant in the next year? No    Would the patient like to discuss contraceptive options today? No      Contraception Wrap Up   Current Method Female Sterilization    Contraception Counseling Provided No             Review of Systems   Constitutional:  Negative for fever and malaise/fatigue.  Respiratory:  Negative for cough.   Cardiovascular: Negative.   Gastrointestinal:  Positive for nausea. Negative for abdominal pain and vomiting.  Genitourinary:  Negative for dysuria.  Skin:  Negative for rash.  Neurological:  Negative for dizziness.      Past Medical History:  Diagnosis Date   Alcohol abuse 05/20/2016   History of suicide attempt 05/20/2016   HIV antibody positive (HCC)    HIV disease (HCC) 01/15/2016   Multiple personality disorder (HCC) 05/20/2016    Outpatient Medications Prior to Visit  Medication Sig Dispense Refill   cabotegravir & rilpivirine ER (CABENUVA) 600 & 900 MG/3ML injection Inject 1 kit into the muscle every 2 (two) months. 6 mL 0   FLUoxetine (PROZAC) 10 MG tablet Take 1 tablet by mouth daily. 30 tablet 0   metroNIDAZOLE (FLAGYL) 500 MG tablet Take 1 tablet (500 mg total) by mouth 2 (two) times daily. 14 tablet 0   ondansetron (ZOFRAN) 8 MG tablet Take 8 mg by mouth. (Patient not taking: Reported on 01/01/2022)     promethazine-dextromethorphan (PROMETHAZINE-DM) 6.25-15 MG/5ML syrup Take 5 mLs by mouth 4 (four) times daily as needed for cough. (Patient not taking: Reported on 01/01/2022) 118 mL 0   No facility-administered medications prior to visit.  No Known Allergies  Social History   Tobacco Use   Smoking status: Never   Smokeless tobacco: Never  Substance Use Topics   Alcohol use: Not Currently    Comment: socially   Drug use: Yes    Types: Marijuana    No family history on file.  Social History   Substance and Sexual Activity  Sexual Activity Not Currently   Partners: Male   Comment: declined condoms     Objective:   Vitals:   01/01/22 0901  BP: 138/79  Pulse: (!) 57  Temp: 97.7 F (36.5 C)  TempSrc: Oral  Weight: 200 lb (90.7 kg)    Body mass index is 37.79 kg/m.  Physical Exam Vitals reviewed.  Constitutional:      Appearance: She is  well-developed.     Comments: Seated comfortably in chair.   HENT:     Mouth/Throat:     Mouth: No oral lesions.     Dentition: Normal dentition. No dental abscesses.     Pharynx: No oropharyngeal exudate.  Cardiovascular:     Rate and Rhythm: Normal rate and regular rhythm.     Heart sounds: Normal heart sounds.  Pulmonary:     Effort: Pulmonary effort is normal.     Breath sounds: Normal breath sounds.  Abdominal:     General: There is no distension.     Palpations: Abdomen is soft.     Tenderness: There is no abdominal tenderness.  Lymphadenopathy:     Cervical: No cervical adenopathy.  Skin:    General: Skin is warm and dry.     Findings: No rash.  Neurological:     Mental Status: She is alert and oriented to person, place, and time.  Psychiatric:        Judgment: Judgment normal.     Comments: In good spirits today and engaged in care discussion     Lab Results Lab Results  Component Value Date   WBC 5.3 09/05/2021   HGB 12.3 09/05/2021   HCT 38.2 09/05/2021   MCV 89.3 09/05/2021   PLT 284 09/05/2021    Lab Results  Component Value Date   CREATININE 1.01 (H) 09/05/2021   BUN 8 09/05/2021   NA 141 09/05/2021   K 4.4 09/05/2021   CL 108 09/05/2021   CO2 25 09/05/2021    Lab Results  Component Value Date   ALT 19 09/05/2021   AST 20 09/05/2021   ALKPHOS 57 09/05/2021   BILITOT 0.1 (L) 09/05/2021    Lab Results  Component Value Date   CHOL 175 08/29/2021   HDL 45 (L) 08/29/2021   LDLCALC 113 (H) 08/29/2021   TRIG 74 08/29/2021   CHOLHDL 3.9 08/29/2021   HIV 1 RNA Quant (Copies/mL)  Date Value  08/29/2021 Not Detected  04/26/2021 Not Detected  03/08/2021 Not Detected   CD4 T Cell Abs (/uL)  Date Value  05/03/2016 430  02/20/2016 470  01/15/2016 620     Assessment & Plan:   Problem List Items Addressed This Visit       Unprioritized   Severe bipolar I disorder, current or most recent episode depressed (HCC)   Relevant Medications    FLUoxetine (PROZAC) 10 MG tablet   HIV disease (HCC) - Primary    Doing well on q14m cabenuva with only minor injection site reactions. She has received her doses at appropriate intervals. Will repeat CD4, VL and check CMP to monitor liver function since starting.   Last VL  result:  HIV 1 RNA Quant (Copies/mL)  Date Value  08/29/2021 Not Detected   Dose Interval: q29m  Target Date: 22nd Next Appointment: 02/20/2022  Contraception Plan: s/p BTL  Pregnancy Test: not needed       Relevant Medications   cabotegravir & rilpivirine ER (CABENUVA) 600 & 900 MG/3ML injection   Other Relevant Orders   HIV 1 RNA quant-no reflex-bld   T-helper cells (CD4) count   COMPLETE METABOLIC PANEL WITH GFR   Preventative health care    HPV #2 today --> final dose due October 2023. Prevnar 20 today. Hep A vaccine recommended - she will consider at future appts.  Needs pap smear for screening --> will have her come back to schedule at her convenience with me (or can do at next appt with me in December 2023.       Severe episode of recurrent major depressive disorder (HCC)    Much improved with Prozac daily. She will take with food to avoid nausea but she would like to continue current dose. Refills provided.       Relevant Medications   FLUoxetine (PROZAC) 10 MG tablet     Rexene Alberts, MSN, NP-C [facility] Pager: [phone] Office: [phone]  01/01/22  9:36 AM

## 2022-01-01 NOTE — Assessment & Plan Note (Addendum)
Doing well on q6m cabenuva with only minor injection site reactions. She has received her doses at appropriate intervals. Will repeat CD4, VL and check CMP to monitor liver function since starting.   Last VL result:  HIV 1 RNA Quant (Copies/mL)  Date Value  08/29/2021 Not Detected    Dose Interval: q52m  Target Date: 22nd Next Appointment: 02/20/2022  Contraception Plan: s/p BTL  Pregnancy Test: not needed

## 2022-01-02 LAB — T-HELPER CELLS (CD4) COUNT (NOT AT ARMC)
CD4 % Helper T Cell: 40 % (ref 33–65)
CD4 T Cell Abs: 917 /uL (ref 400–1790)

## 2022-01-05 LAB — COMPLETE METABOLIC PANEL WITH GFR
AG Ratio: 1.3 (calc) (ref 1.0–2.5)
ALT: 9 U/L (ref 6–29)
AST: 13 U/L (ref 10–30)
Albumin: 4 g/dL (ref 3.6–5.1)
Alkaline phosphatase (APISO): 56 U/L (ref 31–125)
BUN/Creatinine Ratio: 12 (calc) (ref 6–22)
BUN: 12 mg/dL (ref 7–25)
CO2: 22 mmol/L (ref 20–32)
Calcium: 9.4 mg/dL (ref 8.6–10.2)
Chloride: 104 mmol/L (ref 98–110)
Creat: 0.98 mg/dL — ABNORMAL HIGH (ref 0.50–0.97)
Globulin: 3.1 g/dL (calc) (ref 1.9–3.7)
Glucose, Bld: 83 mg/dL (ref 65–99)
Potassium: 4.2 mmol/L (ref 3.5–5.3)
Sodium: 137 mmol/L (ref 135–146)
Total Bilirubin: 0.3 mg/dL (ref 0.2–1.2)
Total Protein: 7.1 g/dL (ref 6.1–8.1)
eGFR: 79 mL/min/{1.73_m2} (ref >=60)

## 2022-01-05 LAB — HIV-1 RNA QUANT-NO REFLEX-BLD
HIV 1 RNA Quant: NOT DETECTED Copies/mL
HIV-1 RNA Quant, Log: NOT DETECTED Log cps/mL

## 2022-02-20 ENCOUNTER — Other Ambulatory Visit: Payer: Self-pay

## 2022-02-20 ENCOUNTER — Other Ambulatory Visit (HOSPITAL_COMMUNITY)
Admission: RE | Admit: 2022-02-20 | Discharge: 2022-02-20 | Disposition: A | Discharge status: Home / Self Care | Payer: BC Managed Care – PPO | Source: Ambulatory Visit | Attending: Infectious Diseases | Admitting: Infectious Diseases

## 2022-02-20 ENCOUNTER — Ambulatory Visit (INDEPENDENT_AMBULATORY_CARE_PROVIDER_SITE_OTHER): Payer: BC Managed Care – PPO | Admitting: Pharmacist

## 2022-02-20 ENCOUNTER — Ambulatory Visit: Payer: BC Managed Care – PPO

## 2022-02-20 DIAGNOSIS — B20 Human immunodeficiency virus [HIV] disease: Secondary | ICD-10-CM

## 2022-02-20 DIAGNOSIS — Z0000 Encounter for general adult medical examination without abnormal findings: Secondary | ICD-10-CM

## 2022-02-20 DIAGNOSIS — Z113 Encounter for screening for infections with a predominantly sexual mode of transmission: Secondary | ICD-10-CM | POA: Diagnosis not present

## 2022-02-20 MED ORDER — CABOTEGRAVIR & RILPIVIRINE ER 600 & 900 MG/3ML IM SUER
1.0000 | Freq: Once | INTRAMUSCULAR | Status: AC
  Administered 2022-02-20: 1 via INTRAMUSCULAR

## 2022-02-20 NOTE — Progress Notes (Signed)
HPI: Brenda Riley is a 31 y.o. female who presents to the [facility] for [place] administration.  Patient Active Problem List   Diagnosis Date Noted   Preventative health care 01/01/2022   Pelvic pain 10/11/2021   Paresthesia of both hands 03/08/2021   History of suicide attempt 05/20/2016   Severe bipolar I disorder, current or most recent episode depressed ([place]) 05/20/2016   Anxiety 01/15/2016   Severe episode of recurrent major depressive disorder ([place]) 12/19/2015   HIV disease ([place]) 12/18/2015   Suicidal behavior 12/18/2015    Patient's Medications  New Prescriptions   No medications on file  Previous Medications   CABOTEGRAVIR & RILPIVIRINE ER (CABENUVA) 600 & 900 MG/3ML INJECTION    Inject 1 kit into the muscle every 2 (two) months.   FLUOXETINE (PROZAC) 10 MG TABLET    Take 1 tablet by mouth daily.  Modified Medications   No medications on file  Discontinued Medications   No medications on file    Allergies: No Known Allergies  Past Medical History: Past Medical History:  Diagnosis Date   Alcohol abuse 05/20/2016   History of suicide attempt 05/20/2016   HIV antibody positive ([place])    HIV disease ([place]) 01/15/2016   Multiple personality disorder ([place]) 05/20/2016    Social History: Social History   Socioeconomic History   Marital status: Significant Other    Spouse name: Not on file   Number of children: Not on file   Years of education: Not on file   Highest education level: Not on file  Occupational History   Not on file  Tobacco Use   Smoking status: Never   Smokeless tobacco: Never  Substance and Sexual Activity   Alcohol use: Not Currently    Comment: socially   Drug use: Yes    Types: Marijuana   Sexual activity: Not Currently    Partners: Male    Comment: declined condoms  Other Topics Concern   Not on file  Social History Narrative   Not on file   Social Determinants of Health   Financial Resource Strain: Not on  file  Food Insecurity: Not on file  Transportation Needs: Not on file  Physical Activity: Not on file  Stress: Not on file  Social Connections: Not on file    Labs: Lab Results  Component Value Date   HIV1RNAQUANT Not Detected 01/01/2022   HIV1RNAQUANT Not Detected 08/29/2021   HIV1RNAQUANT Not Detected 04/26/2021   CD4TABS 917 01/01/2022   CD4TABS 430 05/03/2016   CD4TABS 470 02/20/2016    RPR and STI Lab Results  Component Value Date   LABRPR NON-REACTIVE 10/29/2021   LABRPR NON-REACTIVE 08/29/2021   LABRPR NON-REACTIVE 03/08/2021   LABRPR NON REAC 05/03/2016   LABRPR NON REAC 01/15/2016    STI Results GC CT  09/05/2021  2:40 PM Negative  Negative   09/05/2021 10:25 AM Negative  Negative   05/20/2016 12:00 AM Negative  Negative     Hepatitis B Lab Results  Component Value Date   HEPBSAB POS (A) 01/15/2016   HEPBSAG NEGATIVE 01/15/2016   Hepatitis C No results found for: "HEPCAB", "HCVRNAPCRQN" Hepatitis A Lab Results  Component Value Date   HAV NON-REACTIVE 04/26/2021   Lipids: Lab Results  Component Value Date   CHOL 175 08/29/2021   TRIG 74 08/29/2021   HDL 45 (L) 08/29/2021   CHOLHDL 3.9 08/29/2021   LDLCALC 113 (H) 08/29/2021    TARGET DATE:  The 22nd of the  month  Current HIV Regimen: Cabenuva  Assessment: Brenda Riley presents today for their maintenance Cabenuva injections. Initial/past injections were tolerated well without issues. No problems with systemic effects of injections. Patient did experience mild knots that fade after a few weeks. Administered cabotegravir 674m/3mL in left upper outer quadrant of the gluteal muscle. Administered rilpivirine 900 mg/348min the right upper outer quadrant of the gluteal muscle. Monitored patient for 10 minutes after injection. Injections were tolerated well without issue. Patient will follow up in 2 months for next injection. Will defer HIV RNA today.  She states she has been sexually active with one new  partner and requests urine cytologies today. She also deferred the hepatitis A vaccine today.    Plan: - Cabenuva injections administered - Next injections scheduled for 10/16 with Cassie and 12/18 with [facility] [place] Check urine cytologies - Call with any issues or questions  AmAlfonse SprucePharmD, CPP, BCColumbus Citylinical Pharmacist Practitioner In[facility]

## 2022-02-21 LAB — URINE CYTOLOGY ANCILLARY ONLY
Chlamydia: NEGATIVE
Comment: NEGATIVE
Comment: NORMAL
Neisseria Gonorrhea: NEGATIVE

## 2022-04-22 ENCOUNTER — Encounter: Payer: BC Managed Care – PPO | Admitting: Pharmacist

## 2022-04-25 ENCOUNTER — Other Ambulatory Visit: Payer: Self-pay

## 2022-04-25 ENCOUNTER — Ambulatory Visit: Payer: BC Managed Care – PPO

## 2022-04-25 ENCOUNTER — Ambulatory Visit (INDEPENDENT_AMBULATORY_CARE_PROVIDER_SITE_OTHER): Payer: BC Managed Care – PPO | Admitting: Pharmacist

## 2022-04-25 DIAGNOSIS — Z79899 Other long term (current) drug therapy: Secondary | ICD-10-CM | POA: Diagnosis not present

## 2022-04-25 DIAGNOSIS — Z3202 Encounter for pregnancy test, result negative: Secondary | ICD-10-CM | POA: Diagnosis not present

## 2022-04-25 DIAGNOSIS — Z23 Encounter for immunization: Secondary | ICD-10-CM

## 2022-04-25 DIAGNOSIS — B20 Human immunodeficiency virus [HIV] disease: Secondary | ICD-10-CM | POA: Diagnosis not present

## 2022-04-25 LAB — POCT URINE PREGNANCY: Preg Test, Ur: NEGATIVE

## 2022-04-25 MED ORDER — CABOTEGRAVIR & RILPIVIRINE ER 600 & 900 MG/3ML IM SUER
1.0000 | Freq: Once | INTRAMUSCULAR | Status: AC
  Administered 2022-04-25: 1 via INTRAMUSCULAR

## 2022-04-25 NOTE — Progress Notes (Signed)
HPI: Brenda Riley is a 31 y.o. female who presents to the [facility] for [place] administration.  Patient Active Problem List   Diagnosis Date Noted   Preventative health care 01/01/2022   Pelvic pain 10/11/2021   Paresthesia of both hands 03/08/2021   History of suicide attempt 05/20/2016   Severe bipolar I disorder, current or most recent episode depressed ([place]) 05/20/2016   Anxiety 01/15/2016   Severe episode of recurrent major depressive disorder ([place]) 12/19/2015   HIV disease ([place]) 12/18/2015   Suicidal behavior 12/18/2015    Patient's Medications  New Prescriptions   No medications on file  Previous Medications   CABOTEGRAVIR & RILPIVIRINE ER (CABENUVA) 600 & 900 MG/3ML INJECTION    Inject 1 kit into the muscle every 2 (two) months.   FLUOXETINE (PROZAC) 10 MG TABLET    Take 1 tablet by mouth daily.  Modified Medications   No medications on file  Discontinued Medications   No medications on file    Allergies: No Known Allergies  Past Medical History: Past Medical History:  Diagnosis Date   Alcohol abuse 05/20/2016   History of suicide attempt 05/20/2016   HIV antibody positive ([place])    HIV disease ([place]) 01/15/2016   Multiple personality disorder ([place]) 05/20/2016    Social History: Social History   Socioeconomic History   Marital status: Significant Other    Spouse name: Not on file   Number of children: Not on file   Years of education: Not on file   Highest education level: Not on file  Occupational History   Not on file  Tobacco Use   Smoking status: Never   Smokeless tobacco: Never  Substance and Sexual Activity   Alcohol use: Not Currently    Comment: socially   Drug use: Yes    Types: Marijuana   Sexual activity: Not Currently    Partners: Male    Comment: declined condoms  Other Topics Concern   Not on file  Social History Narrative   Not on file   Social Determinants of Health   Financial Resource Strain: Not on  file  Food Insecurity: Not on file  Transportation Needs: Not on file  Physical Activity: Not on file  Stress: Not on file  Social Connections: Not on file    Labs: Lab Results  Component Value Date   HIV1RNAQUANT Not Detected 01/01/2022   HIV1RNAQUANT Not Detected 08/29/2021   HIV1RNAQUANT Not Detected 04/26/2021   CD4TABS 917 01/01/2022   CD4TABS 430 05/03/2016   CD4TABS 470 02/20/2016    RPR and STI Lab Results  Component Value Date   LABRPR NON-REACTIVE 10/29/2021   LABRPR NON-REACTIVE 08/29/2021   LABRPR NON-REACTIVE 03/08/2021   LABRPR NON REAC 05/03/2016   LABRPR NON REAC 01/15/2016    STI Results GC CT  02/20/2022  9:01 AM Negative  Negative   09/05/2021  2:40 PM Negative  Negative   09/05/2021 10:25 AM Negative  Negative   05/20/2016 12:00 AM Negative  Negative     Hepatitis B Lab Results  Component Value Date   HEPBSAB POS (A) 01/15/2016   HEPBSAG NEGATIVE 01/15/2016   Hepatitis C No results found for: "HEPCAB", "HCVRNAPCRQN" Hepatitis A Lab Results  Component Value Date   HAV NON-REACTIVE 04/26/2021   Lipids: Lab Results  Component Value Date   CHOL 175 08/29/2021   TRIG 74 08/29/2021   HDL 45 (L) 08/29/2021   CHOLHDL 3.9 08/29/2021   LDLCALC 113 (H) 08/29/2021  TARGET DATE: 22nd  Assessment: Lowella Riley presents today for her maintenance Cabenuva injections with her two children. Past injections were tolerated well without issues. However, she shares that over the past 2 weeks she has had a small appetite. HIV-RNA has been detectable, last checked in June. She has been nauseous but does not believe it is due to [place]. She does smoke marijuana, and shares this is the only thing that has helped her to have an appetite. She also has intermittent headaches. She would like her annual flu vaccine in addition to the final Gardasil-9 vaccine today. However, she expresses that she is not interested in receiving any COVID-19 vaccination.    Administered cabotegravir 646m/3mL in left upper outer quadrant of the gluteal muscle. Administered rilpivirine 900 mg/335min the right upper outer quadrant of the gluteal muscle. No issues with injections. She will follow up in 2 months for next set of injections.  Plan: - Cabenuva injections administered - Checked urine pregnancy test per patient request, which resulted as negative - Check HIV-RNA - Administered final Gardasil-9 vaccine and annual flu vaccine - Next injections scheduled for 06/24/22 with [facility] [place]02/19/24 with Cassie  - Call with any issues or questions  CoEliseo GumPharmD PGY1 Pharmacy Resident   04/25/2022  4:56 PM

## 2022-04-28 LAB — HIV-1 RNA QUANT-NO REFLEX-BLD
HIV 1 RNA Quant: NOT DETECTED Copies/mL
HIV-1 RNA Quant, Log: NOT DETECTED Log cps/mL

## 2022-05-09 ENCOUNTER — Encounter: Payer: BC Managed Care – PPO | Admitting: Obstetrics and Gynecology

## 2022-05-25 ENCOUNTER — Other Ambulatory Visit: Payer: Self-pay

## 2022-05-25 ENCOUNTER — Emergency Department (HOSPITAL_COMMUNITY)
Admission: EM | Admit: 2022-05-25 | Discharge: 2022-05-26 | Disposition: A | Discharge status: Home / Self Care | Payer: BC Managed Care – PPO | Attending: Emergency Medicine | Admitting: Emergency Medicine

## 2022-05-25 ENCOUNTER — Emergency Department (HOSPITAL_COMMUNITY): Payer: BC Managed Care – PPO

## 2022-05-25 DIAGNOSIS — M79603 Pain in arm, unspecified: Secondary | ICD-10-CM | POA: Diagnosis not present

## 2022-05-25 DIAGNOSIS — R202 Paresthesia of skin: Secondary | ICD-10-CM | POA: Diagnosis not present

## 2022-05-25 DIAGNOSIS — Z21 Asymptomatic human immunodeficiency virus [HIV] infection status: Secondary | ICD-10-CM | POA: Insufficient documentation

## 2022-05-25 DIAGNOSIS — R0789 Other chest pain: Secondary | ICD-10-CM

## 2022-05-25 DIAGNOSIS — T68XXXA Hypothermia, initial encounter: Secondary | ICD-10-CM | POA: Diagnosis not present

## 2022-05-25 DIAGNOSIS — M7989 Other specified soft tissue disorders: Secondary | ICD-10-CM | POA: Insufficient documentation

## 2022-05-25 DIAGNOSIS — R079 Chest pain, unspecified: Secondary | ICD-10-CM | POA: Diagnosis not present

## 2022-05-25 DIAGNOSIS — J0100 Acute maxillary sinusitis, unspecified: Secondary | ICD-10-CM | POA: Diagnosis not present

## 2022-05-25 DIAGNOSIS — R110 Nausea: Secondary | ICD-10-CM | POA: Diagnosis not present

## 2022-05-25 LAB — I-STAT BETA HCG BLOOD, ED (MC, WL, AP ONLY): I-stat hCG, quantitative: <5 m[IU]/mL (ref <5)

## 2022-05-25 LAB — BASIC METABOLIC PANEL
Anion gap: 8 (ref 5–15)
BUN: 10 mg/dL (ref 6–20)
CO2: 22 mmol/L (ref 22–32)
Calcium: 9.3 mg/dL (ref 8.9–10.3)
Chloride: 108 mmol/L (ref 98–111)
Creatinine, Ser: 1 mg/dL (ref 0.44–1.00)
GFR, Estimated: >60 mL/min (ref >60)
Glucose, Bld: 91 mg/dL (ref 70–99)
Potassium: 3.8 mmol/L (ref 3.5–5.1)
Sodium: 138 mmol/L (ref 135–145)

## 2022-05-25 LAB — CBC WITH DIFFERENTIAL/PLATELET
Abs Immature Granulocytes: 0.01 10*3/uL (ref 0.00–0.07)
Basophils Absolute: 0 10*3/uL (ref 0.0–0.1)
Basophils Relative: 1 %
Eosinophils Absolute: 0.2 10*3/uL (ref 0.0–0.5)
Eosinophils Relative: 3 %
HCT: 39.5 % (ref 36.0–46.0)
Hemoglobin: 12.8 g/dL (ref 12.0–15.0)
Immature Granulocytes: 0 %
Lymphocytes Relative: 57 %
Lymphs Abs: 3.6 10*3/uL (ref 0.7–4.0)
MCH: 28.4 pg (ref 26.0–34.0)
MCHC: 32.4 g/dL (ref 30.0–36.0)
MCV: 87.8 fL (ref 80.0–100.0)
Monocytes Absolute: 0.4 10*3/uL (ref 0.1–1.0)
Monocytes Relative: 6 %
Neutro Abs: 2.1 10*3/uL (ref 1.7–7.7)
Neutrophils Relative %: 33 %
Platelets: 259 10*3/uL (ref 150–400)
RBC: 4.5 MIL/uL (ref 3.87–5.11)
RDW: 13.1 % (ref 11.5–15.5)
WBC: 6.4 10*3/uL (ref 4.0–10.5)
nRBC: 0 % (ref 0.0–0.2)

## 2022-05-25 LAB — TROPONIN I (HIGH SENSITIVITY): Troponin I (High Sensitivity): <2 ng/L (ref <18)

## 2022-05-25 MED ORDER — HYDROXYZINE HCL 25 MG PO TABS
25.0000 mg | ORAL_TABLET | Freq: Once | ORAL | Status: AC
  Administered 2022-05-25: 25 mg via ORAL
  Filled 2022-05-25: qty 1

## 2022-05-25 NOTE — ED Triage Notes (Signed)
Pt arrives via [facility] from home w c/o chest pain, right arm pain. 3 days of symptoms. Hx of anxiety, feels similar, but has never had arm pain prior for the same. 138/100, hr 60, 97% RA.

## 2022-05-25 NOTE — ED Triage Notes (Signed)
Pt reporting that she is having midline chest pain, that goes through her right shoulder and right arm, right back pain. Unable to eat d/t the nausea.

## 2022-05-25 NOTE — ED Provider Triage Note (Signed)
Emergency Medicine Provider Triage Evaluation Note  Brenda Riley , a 31 y.o. female  was evaluated in triage.  Pt complains of chest pain. States she has had right sided chest pain that radiates into her right shoulder and arm for 3 days. States that it is sharp and severe. She denies having shortness of breath or palpitations. She is almost inconsolably crying throughout the exam and states that she feels anxious and stressed. States that she takes prozac but has been having significant life stress recently. .  Review of Systems  Positive: See above Negative:   Physical Exam  BP (!) 120/94 (BP Location: Right Arm)   Pulse (!) 55   Temp 97.9 F (36.6 C)   Resp (!) 22   LMP 05/20/2022   SpO2 100%  Gen:   Awake, crying  Resp:  Normal effort  MSK:   Moves extremities without difficulty  Other:  Lung sounds clear. S1s2 without murmur  Medical Decision Making  Medically screening exam initiated at 10:31 PM.  Appropriate orders placed.  LEIDI ASTLE was informed that the remainder of the evaluation will be completed by another provider, this initial triage assessment does not replace that evaluation, and the importance of remaining in the ED until their evaluation is complete.     Cristopher Peru, PA-C 05/25/22 2232

## 2022-05-26 ENCOUNTER — Encounter (HOSPITAL_COMMUNITY): Payer: Self-pay

## 2022-05-26 LAB — TROPONIN I (HIGH SENSITIVITY): Troponin I (High Sensitivity): <2 ng/L (ref <18)

## 2022-05-26 MED ORDER — AMOXICILLIN 500 MG PO CAPS: 500.0000 mg | ORAL_CAPSULE | Freq: Three times a day (TID) | ORAL | 0 refills | Status: DC

## 2022-05-26 NOTE — Discharge Instructions (Signed)
It seems that you have a sinus infection today.  In addition to the antibiotic try doing an saline nasal spray or Nettie pot to help with sinus congestion.  It is okay to take Tylenol as needed for aches and pains in the chest pain.  Make sure you are staying hydrated and getting plenty of rest.

## 2022-05-26 NOTE — ED Provider Notes (Signed)
[facility] EMERGENCY DEPARTMENT Provider Note   CSN: [id number] Arrival date & time: 05/25/22  2218     History  Chief Complaint  Patient presents with   Chest Pain    Brenda Riley is a 31 y.o. female.  Patient is a 31 year old female with a history of HIV on Cabenuva with undetectable viral loads, multiple personality disorder and prior substance use who is presenting today with complaints of 2 to 3 weeks of cough, congestion, headache, facial pain, tingling in her hands and chest pain that is worse with taking a deep breath or coughing.  At no time has she had a fever but continues to notice drainage.  She denies a productive cough or hemoptysis.  She has not had significant shortness of breath.  She denies any neck pain or syncope.  She has no nausea or vomiting but does report she has had significant decrease in her appetite.  She is not today taking anything for her symptoms but reports just prior to starting to feel bad she had multiple vaccines including flu, HPV and pneumonia shots.  She has not noticed any swelling in her legs.  She is on only 2 medications and she has been taking them for over a year.  The history is provided by the patient.  Chest Pain      Home Medications Prior to Admission medications   Medication Sig Start Date End Date Taking? Authorizing Provider  amoxicillin (AMOXIL) 500 MG capsule Take 1 capsule (500 mg total) by mouth 3 (three) times daily. 05/26/22  Yes Blanchie Dessert, MD  cabotegravir & rilpivirine ER (CABENUVA) 600 & 900 MG/3ML injection Inject 1 kit into the muscle every 2 (two) months. 01/01/22   Esmond Plants, RPH-CPP  FLUoxetine (PROZAC) 10 MG tablet Take 1 tablet by mouth daily. 01/01/22   [place] Callas, NP      Allergies    Patient has no known allergies.    Review of Systems   Review of Systems  Cardiovascular:  Positive for chest pain.    Physical Exam Updated Vital Signs BP 129/83   Pulse  (!) 56   Temp 98.3 F (36.8 C)   Resp 17   LMP 05/20/2022   SpO2 99%  Physical Exam Vitals and nursing note reviewed.  Constitutional:      General: She is not in acute distress.    Appearance: She is well-developed.  HENT:     Head: Normocephalic and atraumatic.     Right Ear: Tympanic membrane normal.     Left Ear: Tympanic membrane normal.     Nose: Congestion present.     Right Sinus: Maxillary sinus tenderness and frontal sinus tenderness present.     Left Sinus: No maxillary sinus tenderness or frontal sinus tenderness.     Mouth/Throat:     Mouth: Mucous membranes are moist.     Pharynx: No oropharyngeal exudate or posterior oropharyngeal erythema.  Eyes:     Pupils: Pupils are equal, round, and reactive to light.  Cardiovascular:     Rate and Rhythm: Normal rate and regular rhythm.     Heart sounds: Normal heart sounds. No murmur heard.    No friction rub.  Pulmonary:     Effort: Pulmonary effort is normal.     Breath sounds: Normal breath sounds. No wheezing or rales.  Chest:     Chest wall: Tenderness present.    Abdominal:     General: Bowel sounds are normal.  There is no distension.     Palpations: Abdomen is soft.     Tenderness: There is no abdominal tenderness. There is no guarding or rebound.  Musculoskeletal:        General: No tenderness. Normal range of motion.     Cervical back: Normal range of motion and neck supple. No tenderness.     Right lower leg: No edema.     Left lower leg: No edema.     Comments: No edema  Skin:    General: Skin is warm and dry.     Findings: No rash.  Neurological:     Mental Status: She is alert and oriented to person, place, and time. Mental status is at baseline.     Cranial Nerves: No cranial nerve deficit.  Psychiatric:        Mood and Affect: Mood normal.        Behavior: Behavior normal.     ED Results / Procedures / Treatments   Labs (all labs ordered are listed, but only abnormal results are  displayed) Labs Reviewed  BASIC METABOLIC PANEL  CBC WITH DIFFERENTIAL/PLATELET  I-STAT BETA HCG BLOOD, ED ([facility], [facility], [facility] ONLY)  TROPONIN I (HIGH SENSITIVITY)  TROPONIN I (HIGH SENSITIVITY)    EKG EKG Interpretation  Date/Time:  Saturday May 25 2022 23:03:16 EST Ventricular Rate:  59 PR Interval:  140 QRS Duration: 72 QT Interval:  392 QTC Calculation: 388 R Axis:   50 Text Interpretation: Sinus bradycardia with marked sinus arrhythmia Otherwise normal ECG When compared with ECG of 21-Oct-2008 18:23, PREVIOUS ECG IS PRESENT Confirmed by Blanchie Dessert [phone]) on 05/26/2022 10:22:09 AM  Radiology DG Chest 2 View  Result Date: 05/25/2022 CLINICAL DATA:  Chest pain and nausea. EXAM: CHEST - 2 VIEW COMPARISON:  10/03/2018 FINDINGS: The cardiomediastinal contours are normal. The lungs are clear. Pulmonary vasculature is normal. No consolidation, pleural effusion, or pneumothorax. No acute osseous abnormalities are seen. IMPRESSION: Negative radiographs of the chest. Electronically Signed   By: Keith Rake M.D.   On: 05/25/2022 23:24    Procedures Procedures    Medications Ordered in ED Medications  hydrOXYzine (ATARAX) tablet 25 mg (25 mg Oral Given 05/25/22 2259)    ED Course/ Medical Decision Making/ A&P                           Medical Decision Making Amount and/or Complexity of Data Reviewed External Data Reviewed: notes. Labs: ordered. Decision-making details documented in ED Course. Radiology: ordered and independent interpretation performed. Decision-making details documented in ED Course. ECG/medicine tests: ordered and independent interpretation performed. Decision-making details documented in ED Course.  Risk Prescription drug management.   Pt with multiple medical problems and comorbidities and presenting today with a complaint that caries a high risk for morbidity and mortality.  Here today with 2 to 3 weeks of URI type symptoms, facial pain and  headache.  Patient does have a history of HIV but viral loads are undetectable and she is compliant with her medications.  Low suspicion for an immunocompromising illness.  Patient has full range of motion of her head, no altered mental status and low suspicion for encephalitis or meningitis at this time.  She does have to significant tenderness over her right maxillary and frontal sinus with her symptoms now being for 2 to 3 weeks we will treat for sinusitis.  I independently interpreted patient's labs and EKG.  EKG within normal limits today,  CBC, BMP, troponin x2 are all negative.  I have independently visualized and interpreted pt's images today.  chest x-ray without acute findings.  Low suspicion at this time for pneumonia, pericarditis, myocarditis, PE.  Findings discussed with the patient.  At this time she is stable for discharge does not require any further testing.         Final Clinical Impression(s) / ED Diagnoses Final diagnoses:  Acute non-recurrent maxillary sinusitis  Chest wall pain    Rx / DC Orders ED Discharge Orders          Ordered    amoxicillin (AMOXIL) 500 MG capsule  3 times daily        05/26/22 1039              Blanchie Dessert, MD 05/26/22 1040

## 2022-06-06 ENCOUNTER — Telehealth: Payer: BC Managed Care – PPO | Admitting: Physician Assistant

## 2022-06-06 DIAGNOSIS — B3731 Acute candidiasis of vulva and vagina: Secondary | ICD-10-CM

## 2022-06-06 MED ORDER — FLUCONAZOLE 150 MG PO TABS: ORAL_TABLET | ORAL | 0 refills | Status: DC

## 2022-06-06 NOTE — Progress Notes (Signed)
E-Visit for Vaginal Symptoms  We are sorry that you are not feeling well. Here is how we plan to help! Based on what you shared with me it looks like you: May have a yeast vaginosis caused by antibiotic use. Giving the mention of vaginal pain and back pain, however, if not resolving quickly with treatment below, I want you to be evaluated in person.   Vaginosis is an inflammation of the vagina that can result in discharge, itching and pain. The cause is usually a change in the normal balance of vaginal bacteria or an infection. Vaginosis can also result from reduced estrogen levels after menopause.  The most common causes of vaginosis are:   Bacterial vaginosis which results from an overgrowth of one on several organisms that are normally present in your vagina.   Yeast infections which are caused by a naturally occurring fungus called candida.   Vaginal atrophy (atrophic vaginosis) which results from the thinning of the vagina from reduced estrogen levels after menopause.   Trichomoniasis which is caused by a parasite and is commonly transmitted by sexual intercourse.  Factors that increase your risk of developing vaginosis include: Medications, such as antibiotics and steroids Uncontrolled diabetes Use of hygiene products such as bubble bath, vaginal spray or vaginal deodorant Douching Wearing damp or tight-fitting clothing Using an intrauterine device (IUD) for birth control Hormonal changes, such as those associated with pregnancy, birth control pills or menopause Sexual activity Having a sexually transmitted infection  Your treatment plan is  Diflucan (fluconazole) 150mg  tablet once, repeating again in 3 days.  I have electronically sent this prescription into the pharmacy that you have chosen.  Be sure to take all of the medication as directed. Stop taking any medication if you develop a rash, tongue swelling or shortness of breath. Mothers who are breast feeding should consider  pumping and discarding their breast milk while on these antibiotics. However, there is no consensus that infant exposure at these doses would be harmful.  Remember that medication creams can weaken latex condoms.   HOME CARE:  Good hygiene may prevent some types of vaginosis from recurring and may relieve some symptoms:  Avoid baths, hot tubs and whirlpool spas. Rinse soap from your outer genital area after a shower, and dry the area well to prevent irritation. Don't use scented or harsh soaps, such as those with deodorant or antibacterial action. Avoid irritants. These include scented tampons and pads. Wipe from front to back after using the toilet. Doing so avoids spreading fecal bacteria to your vagina.  Other things that may help prevent vaginosis include:  Don't douche. Your vagina doesn't require cleansing other than normal bathing. Repetitive douching disrupts the normal organisms that reside in the vagina and can actually increase your risk of vaginal infection. Douching won't clear up a vaginal infection. Use a latex condom. Both female and female latex condoms may help you avoid infections spread by sexual contact. Wear cotton underwear. Also wear pantyhose with a cotton crotch. If you feel comfortable without it, skip wearing underwear to bed. Yeast thrives in [place] Your symptoms should improve in the next day or two.  GET HELP RIGHT AWAY IF:  You have pain in your lower abdomen ( pelvic area or over your ovaries) You develop nausea or vomiting You develop a fever Your discharge changes or worsens You have persistent pain with intercourse You develop shortness of breath, a rapid pulse, or you faint.  These symptoms could be signs of problems  or infections that need to be evaluated by a medical provider now.  MAKE SURE YOU   Understand these instructions. Will watch your condition. Will get help right away if you are not doing well or get worse.  Thank you  for choosing an e-visit.  Your e-visit answers were reviewed by a board certified advanced clinical practitioner to complete your personal care plan. Depending upon the condition, your plan could have included both over the counter or prescription medications.  Please review your pharmacy choice. Make sure the pharmacy is open so you can pick up prescription now. If there is a problem, you may contact your provider through [employer] and have the prescription routed to another pharmacy.  Your safety is important to [place]. If you have drug allergies check your prescription carefully.   For the next 24 hours you can use MyChart to ask questions about today's visit, request a non-urgent call back, or ask for a work or school excuse. You will get an email in the next two days asking about your experience. I hope that your e-visit has been valuable and will speed your recovery.

## 2022-06-06 NOTE — Progress Notes (Signed)
I have spent 5 minutes in review of e-visit questionnaire, review and updating patient chart, medical decision making and response to patient.   [place] Skalicky Cody Daiden Coltrane, PA-C    

## 2022-06-13 ENCOUNTER — Telehealth: Payer: BC Managed Care – PPO | Admitting: Family Medicine

## 2022-06-13 DIAGNOSIS — R112 Nausea with vomiting, unspecified: Secondary | ICD-10-CM

## 2022-06-13 DIAGNOSIS — B20 Human immunodeficiency virus [HIV] disease: Secondary | ICD-10-CM

## 2022-06-13 DIAGNOSIS — R6883 Chills (without fever): Secondary | ICD-10-CM

## 2022-06-13 DIAGNOSIS — R197 Diarrhea, unspecified: Secondary | ICD-10-CM

## 2022-06-13 DIAGNOSIS — R051 Acute cough: Secondary | ICD-10-CM

## 2022-06-13 NOTE — Progress Notes (Signed)
Brenda Riley

## 2022-06-24 ENCOUNTER — Ambulatory Visit (INDEPENDENT_AMBULATORY_CARE_PROVIDER_SITE_OTHER): Payer: BC Managed Care – PPO | Admitting: Infectious Diseases

## 2022-06-24 ENCOUNTER — Other Ambulatory Visit: Payer: Self-pay

## 2022-06-24 ENCOUNTER — Encounter: Payer: Self-pay | Admitting: Infectious Diseases

## 2022-06-24 VITALS — BP 127/80 | HR 78 | Temp 97.4°F | Ht 61.0 in | Wt 195.0 lb

## 2022-06-24 DIAGNOSIS — B20 Human immunodeficiency virus [HIV] disease: Secondary | ICD-10-CM

## 2022-06-24 DIAGNOSIS — S025XXA Fracture of tooth (traumatic), initial encounter for closed fracture: Secondary | ICD-10-CM | POA: Diagnosis not present

## 2022-06-24 DIAGNOSIS — F314 Bipolar disorder, current episode depressed, severe, without psychotic features: Secondary | ICD-10-CM

## 2022-06-24 DIAGNOSIS — Z0000 Encounter for general adult medical examination without abnormal findings: Secondary | ICD-10-CM

## 2022-06-24 MED ORDER — CHLORHEXIDINE GLUCONATE 0.12 % MT SOLN: 15.0000 mL | Freq: Two times a day (BID) | OROMUCOSAL | 1 refills | Status: DC

## 2022-06-24 MED ORDER — CABOTEGRAVIR & RILPIVIRINE ER 600 & 900 MG/3ML IM SUER
1.0000 | Freq: Once | INTRAMUSCULAR | Status: AC
  Administered 2022-06-24: 1 via INTRAMUSCULAR

## 2022-06-24 NOTE — Assessment & Plan Note (Signed)
Will need menveo booster at next OV. Pap smear also overdue - she is looking for PCP and I helped her with some recommendations today.  Declined covid booster

## 2022-06-24 NOTE — Assessment & Plan Note (Signed)
Her mood has been under good control on current dose paxil and not acutely worsened on cabenuva.

## 2022-06-24 NOTE — Assessment & Plan Note (Signed)
Very well controlled q102m Cabenuva injections. No concerns with access or adherence to medication. They are tolerating the medication well without side effects. No drug interactions identified. We can start spacing out lab checks to every 4-6 months. I reviewed all labs since our last office visit.  No changes to insurance coverage.  Acute dental need today - see details No concern over anxious/depressed mood.  Sexual health and family planning discussed - no needs today Vaccines updated today - see vaccine counseling   08/26/2022 - date of next appt with our pharmacy team.

## 2022-06-24 NOTE — Assessment & Plan Note (Signed)
Broken right upper wisdom tooth w/o signs of acute infection. She does have a sharp area that is causing some mechanical trauma to the gumline. Will start CHG rinses BID and help her get back with dental team.

## 2022-06-24 NOTE — Patient Instructions (Addendum)
  For primary care services, please call one of the following clinics for a new patient appointment:   Each clinic have different programs to support your health care needs when you don't have access to health insurance.   [facility]  [address] (3rd floor)  [phone]  [facility]  [address], [place], [place] [address] Phone: [phone]  Video Visits:  [email or web address]   Please call Claris Che @ [phone] to schedule a dental appointment.    08/26/2022 is your next shot appointment  We can start spacing out your lab monitoring to 2-3 times a year (so every 2-3 injections).   I would recommend the meningitis vaccine at your next appointment - you will be due for a booster. This is every 5 years.

## 2022-06-24 NOTE — Progress Notes (Signed)
Name: Brenda Riley  DOB: [birth year] MRN: [id number] PCP: Patient, No Pcp Per    Brief Narrative:  Brenda Riley is a 31 y.o. female with HIV, Dx 09-2012 CD4 nadir > 200 reported HIV Risk: heterosexual contact History of OIs: none Intake Labs: Hep B sAg (-), sAb (+) 2017, Hep A (), Hep C () Quantiferon () HLA B*5701 () G6PD: ()   Previous Regimens: 2014 - kaletra + combivir  Stribild Tivicay + Truvada 2017 Cabenuva 04-2021  Genotypes: None on file currently.   Subjective:   Chief Complaint  Patient presents with   Follow-up     HPI: Doing very well on Cabenuva injections - loves this option for her. Has received all ijnections on time without significant side effects. Depression remains under   ER visit for sinus infection about 1 m ago. Received rx for amoxicillin. Yeast infection from this but all better now. Has noticed more illnesses this year.  Has received flu shot. Declines covid booster. Pap smear overdue. Is looking for a primary care provider for her and pediatrician for her 2 kids.   Has had increased dental pain in th upper right mandible. Pulled out a shard from her wisdom tooth recently and is quite tender. No drainage but difficult to eat with it.    [place] Office Visit from 06/24/2022 in [facility]  PHQ-2 Total Score 1         Upstream - 06/24/22 0848       Pregnancy Intention Screening   Does the patient want to become pregnant in the next year? No    Does the patient's partner want to become pregnant in the next year? N/A    Would the patient like to discuss contraceptive options today? Yes      Contraception Wrap Up   Contraception Counseling Provided Yes             Review of Systems  Constitutional:  Negative for fever and malaise/fatigue.  Respiratory:  Negative for cough.   Cardiovascular: Negative.   Gastrointestinal:  Positive for nausea. Negative for abdominal  pain and vomiting.  Genitourinary:  Negative for dysuria.  Skin:  Negative for rash.  Neurological:  Negative for dizziness.      Past Medical History:  Diagnosis Date   Alcohol abuse 05/20/2016   History of suicide attempt 05/20/2016   HIV antibody positive ([place])    HIV disease ([place]) 01/15/2016   Multiple personality disorder ([place]) 05/20/2016    Outpatient Medications Prior to Visit  Medication Sig Dispense Refill   cabotegravir & rilpivirine ER (CABENUVA) 600 & 900 MG/3ML injection Inject 1 kit into the muscle every 2 (two) months. 6 mL 5   FLUoxetine (PROZAC) 10 MG tablet Take 1 tablet by mouth daily. 30 tablet 5   amoxicillin (AMOXIL) 500 MG capsule Take 1 capsule (500 mg total) by mouth 3 (three) times daily. (Patient not taking: Reported on 06/24/2022) 21 capsule 0   fluconazole (DIFLUCAN) 150 MG tablet Take 1 tablet PO once. Repeat in 3 days if needed. (Patient not taking: Reported on 06/24/2022) 2 tablet 0   No facility-administered medications prior to visit.     No Known Allergies  Social History   Tobacco Use   Smoking status: Never   Smokeless tobacco: Never  Substance Use Topics   Alcohol use: Not Currently    Comment: socially   Drug use: Yes    Types: Marijuana  No family history on file.  Social History   Substance and Sexual Activity  Sexual Activity Not Currently   Partners: Male   Comment: declined condoms     Objective:   Vitals:   06/24/22 0848  BP: 127/80  Pulse: 78  Temp: (!) 97.4 F (36.3 C)  TempSrc: Oral  SpO2: 99%  Weight: 195 lb (88.5 kg)  Height: _0  (1.549 m)    Body mass index is 36.84 kg/m.  Physical Exam Vitals reviewed.  Constitutional:      Appearance: She is well-developed.     Comments: Seated comfortably in chair.   HENT:     Mouth/Throat:     Mouth: No oral lesions.     Dentition: Normal dentition. No dental abscesses.     Pharynx: No oropharyngeal exudate.      Comments: Broken tooth noted  without any current signs of infection. She does have a sharp area that is cutting into the gumline I can see.  Cardiovascular:     Rate and Rhythm: Normal rate and regular rhythm.     Heart sounds: Normal heart sounds.  Pulmonary:     Effort: Pulmonary effort is normal.     Breath sounds: Normal breath sounds.  Abdominal:     General: There is no distension.     Palpations: Abdomen is soft.     Tenderness: There is no abdominal tenderness.  Lymphadenopathy:     Cervical: No cervical adenopathy.  Skin:    General: Skin is warm and dry.     Findings: No rash.  Neurological:     Mental Status: She is alert and oriented to person, place, and time.  Psychiatric:        Judgment: Judgment normal.     Comments: In good spirits today and engaged in care discussion     Lab Results Lab Results  Component Value Date   WBC 6.4 05/25/2022   HGB 12.8 05/25/2022   HCT 39.5 05/25/2022   MCV 87.8 05/25/2022   PLT 259 05/25/2022    Lab Results  Component Value Date   CREATININE 1.00 05/25/2022   BUN 10 05/25/2022   NA 138 05/25/2022   K 3.8 05/25/2022   CL 108 05/25/2022   CO2 22 05/25/2022    Lab Results  Component Value Date   ALT 9 01/01/2022   AST 13 01/01/2022   ALKPHOS 57 09/05/2021   BILITOT 0.3 01/01/2022    Lab Results  Component Value Date   CHOL 175 08/29/2021   HDL 45 (L) 08/29/2021   LDLCALC 113 (H) 08/29/2021   TRIG 74 08/29/2021   CHOLHDL 3.9 08/29/2021   HIV 1 RNA Quant (Copies/mL)  Date Value  04/25/2022 Not Detected  01/01/2022 Not Detected  08/29/2021 Not Detected   CD4 T Cell Abs (/uL)  Date Value  01/01/2022 917  05/03/2016 430  02/20/2016 470     Assessment & Plan:   Problem List Items Addressed This Visit       Unprioritized   Broken tooth    Broken right upper wisdom tooth w/o signs of acute infection. She does have a sharp area that is causing some mechanical trauma to the gumline. Will start CHG rinses BID and help her get back  with dental team.       HIV disease ([place]) - Primary    Very well controlled q45mCabenuva injections. No concerns with access or adherence to medication. They are tolerating the medication well without side  effects. No drug interactions identified. We can start spacing out lab checks to every 4-6 months. I reviewed all labs since our last office visit.  No changes to insurance coverage.  Acute dental need today - see details No concern over anxious/depressed mood.  Sexual health and family planning discussed - no needs today Vaccines updated today - see vaccine counseling   08/26/2022 - date of next appt with our pharmacy team.         Preventative health care    Will need menveo booster at next OV. Pap smear also overdue - she is looking for PCP and I helped her with some recommendations today.  Declined covid booster      Severe bipolar I disorder, current or most recent episode depressed ([place])    Her mood has been under good control on current dose paxil and not acutely worsened on cabenuva.        Janene Madeira, MSN, NP-C [facility]ious [place] Pager: [phone] Office: [phone]  06/24/22  9:43 AM

## 2022-06-28 ENCOUNTER — Encounter: Payer: BC Managed Care – PPO | Admitting: Obstetrics and Gynecology

## 2022-07-03 ENCOUNTER — Other Ambulatory Visit: Payer: Self-pay | Admitting: Infectious Diseases

## 2022-07-03 DIAGNOSIS — F314 Bipolar disorder, current episode depressed, severe, without psychotic features: Secondary | ICD-10-CM

## 2022-07-03 NOTE — Telephone Encounter (Signed)
Refills provided while she is looking for PCP to help with further monitoring and care.

## 2022-07-03 NOTE — Telephone Encounter (Signed)
Okay to refill? 

## 2022-07-16 ENCOUNTER — Other Ambulatory Visit (HOSPITAL_COMMUNITY): Payer: Self-pay

## 2022-08-02 ENCOUNTER — Telehealth: Payer: BC Managed Care – PPO | Admitting: Physician Assistant

## 2022-08-02 DIAGNOSIS — M26629 Arthralgia of temporomandibular joint, unspecified side: Secondary | ICD-10-CM

## 2022-08-02 MED ORDER — NAPROXEN 500 MG PO TABS: 500.0000 mg | ORAL_TABLET | Freq: Two times a day (BID) | ORAL | 0 refills | Status: AC

## 2022-08-02 NOTE — Progress Notes (Signed)

## 2022-08-26 ENCOUNTER — Other Ambulatory Visit: Payer: Self-pay

## 2022-08-26 ENCOUNTER — Ambulatory Visit (INDEPENDENT_AMBULATORY_CARE_PROVIDER_SITE_OTHER): Payer: BC Managed Care – PPO | Admitting: Pharmacist

## 2022-08-26 DIAGNOSIS — B20 Human immunodeficiency virus [HIV] disease: Secondary | ICD-10-CM

## 2022-08-26 DIAGNOSIS — Z23 Encounter for immunization: Secondary | ICD-10-CM | POA: Diagnosis not present

## 2022-08-26 MED ORDER — CABOTEGRAVIR & RILPIVIRINE ER 600 & 900 MG/3ML IM SUER
1.0000 | Freq: Once | INTRAMUSCULAR | Status: AC
  Administered 2022-08-26: 1 via INTRAMUSCULAR

## 2022-08-26 NOTE — Progress Notes (Signed)
HPI: Brenda Riley is a 32 y.o. female who presents to the [place] clinic for [place] administration.  Patient Active Problem List   Diagnosis Date Noted   Broken tooth 06/24/2022   Preventative health care 01/01/2022   Pelvic pain 10/11/2021   Paresthesia of both hands 03/08/2021   History of suicide attempt 05/20/2016   Severe bipolar I disorder, current or most recent episode depressed ([place]) 05/20/2016   Anxiety 01/15/2016   Severe episode of recurrent major depressive disorder ([place]) 12/19/2015   HIV disease ([place]) 12/18/2015   Suicidal behavior 12/18/2015    Patient's Medications  New Prescriptions   No medications on file  Previous Medications   AMOXICILLIN (AMOXIL) 500 MG CAPSULE    Take 1 capsule (500 mg total) by mouth 3 (three) times daily.   CABOTEGRAVIR & RILPIVIRINE ER (CABENUVA) 600 & 900 MG/3ML INJECTION    Inject 1 kit into the muscle every 2 (two) months.   CHLORHEXIDINE (PERIDEX) 0.12 % SOLUTION    Use as directed 15 mLs in the mouth or throat 2 (two) times daily.   FLUCONAZOLE (DIFLUCAN) 150 MG TABLET    Take 1 tablet PO once. Repeat in 3 days if needed.   FLUOXETINE (PROZAC) 10 MG TABLET    TAKE 1 TABLET BY MOUTH DAILY   NAPROXEN (NAPROSYN) 500 MG TABLET    Take 1 tablet (500 mg total) by mouth 2 (two) times daily with a meal.  Modified Medications   No medications on file  Discontinued Medications   No medications on file    Allergies: No Known Allergies  Past Medical History: Past Medical History:  Diagnosis Date   Alcohol abuse 05/20/2016   History of suicide attempt 05/20/2016   HIV antibody positive ([place])    HIV disease ([place]) 01/15/2016   Multiple personality disorder ([place]) 05/20/2016    Social History: Social History   Socioeconomic History   Marital status: Significant Other    Spouse name: Not on file   Number of children: Not on file   Years of education: Not on file   Highest education level: Not on file  Occupational  History   Not on file  Tobacco Use   Smoking status: Never   Smokeless tobacco: Never  Substance and Sexual Activity   Alcohol use: Not Currently    Comment: socially   Drug use: Yes    Types: Marijuana   Sexual activity: Not Currently    Partners: Male    Comment: declined condoms  Other Topics Concern   Not on file  Social History Narrative   Not on file   Social Determinants of Health   Financial Resource Strain: Not on file  Food Insecurity: Not on file  Transportation Needs: Not on file  Physical Activity: Not on file  Stress: Not on file  Social Connections: Not on file    Labs: Lab Results  Component Value Date   HIV1RNAQUANT Not Detected 04/25/2022   HIV1RNAQUANT Not Detected 01/01/2022   HIV1RNAQUANT Not Detected 08/29/2021   CD4TABS 917 01/01/2022   CD4TABS 430 05/03/2016   CD4TABS 470 02/20/2016    RPR and STI Lab Results  Component Value Date   LABRPR NON-REACTIVE 10/29/2021   LABRPR NON-REACTIVE 08/29/2021   LABRPR NON-REACTIVE 03/08/2021   LABRPR NON REAC 05/03/2016   LABRPR NON REAC 01/15/2016    STI Results GC CT  02/20/2022  9:01 AM Negative  Negative   09/05/2021  2:40 PM Negative  Negative  09/05/2021 10:25 AM Negative  Negative   05/20/2016 12:00 AM Negative  Negative     Hepatitis B Lab Results  Component Value Date   HEPBSAB POS (A) 01/15/2016   HEPBSAG NEGATIVE 01/15/2016   Hepatitis C No results found for: "HEPCAB", "HCVRNAPCRQN" Hepatitis A Lab Results  Component Value Date   HAV NON-REACTIVE 04/26/2021   Lipids: Lab Results  Component Value Date   CHOL 175 08/29/2021   TRIG 74 08/29/2021   HDL 45 (L) 08/29/2021   CHOLHDL 3.9 08/29/2021   LDLCALC 113 (H) 08/29/2021    TARGET DATE: The 22nd  Assessment: Brenda Riley presents today for her maintenance Cabenuva injections. Past injections were tolerated well without issues. Will check a HIV RNA and CD4 today. Last HIV RNA was undetectable in October 2023 and CD4 was  917 in June 2023. Due for meningococcal vaccine so will start that series today. Will need next one in 8 weeks (next Cab injection visit).  Administered cabotegravir '600mg'$ /46m in left upper outer quadrant of the gluteal muscle. Administered rilpivirine 900 mg/332min the right upper outer quadrant of the gluteal muscle. No issues with injections. She will follow up in 2 months for next set of injections.  Plan: - Cabenuva injections administered - HIV RNA and CD4 today - Menveo #1 today (next due in April 2024) - Next injections scheduled for 10/22/22 with me and 12/30/22 with [place] [place] Call with any issues or questions  Brenda Riley, PharmD, BCIDP, AAHIVP, CPTempelinical Pharmacist Practitioner [place] [facility]

## 2022-08-27 LAB — T-HELPER CELL (CD4) - (RCID CLINIC ONLY)
CD4 % Helper T Cell: 48 % (ref 33–65)
CD4 T Cell Abs: 989 /uL (ref 400–1790)

## 2022-08-28 LAB — HIV-1 RNA QUANT-NO REFLEX-BLD
HIV 1 RNA Quant: <20 Copies/mL — ABNORMAL HIGH
HIV-1 RNA Quant, Log: <1.3 Log cps/mL — ABNORMAL HIGH

## 2022-10-16 ENCOUNTER — Telehealth: Payer: BC Managed Care – PPO | Admitting: Nurse Practitioner

## 2022-10-16 DIAGNOSIS — R112 Nausea with vomiting, unspecified: Secondary | ICD-10-CM | POA: Diagnosis not present

## 2022-10-16 MED ORDER — ONDANSETRON HCL 4 MG PO TABS: 4.0000 mg | ORAL_TABLET | Freq: Three times a day (TID) | ORAL | 0 refills | Status: DC | PRN

## 2022-10-16 NOTE — Progress Notes (Signed)
E-Visit for Vomiting  We are sorry that you are not feeling well. Here is how we plan to help!  Based on what you have shared with me it looks like you have a Virus that is irritating your GI tract.  Vomiting is the forceful emptying of a portion of the stomach's content through the mouth.  Although nausea and vomiting can make you feel miserable, it's important to remember that these are not diseases, but rather symptoms of an underlying illness.  When we treat short term symptoms, we always caution that any symptoms that persist should be fully evaluated in a medical office.  I have prescribed a medication that will help alleviate your symptoms and allow you to stay hydrated:  Zofran 4 mg 1 tablet every 8 hours as needed for nausea and vomiting  HOME CARE: Drink clear liquids.  This is very important! Dehydration (the lack of fluid) can lead to a serious complication.  Start off with 1 tablespoon every 5 minutes for 8 hours. You may begin eating bland foods after 8 hours without vomiting.  Start with saltine crackers, white bread, rice, mashed potatoes, applesauce. After 48 hours on a bland diet, you may resume a normal diet. Try to go to sleep.  Sleep often empties the stomach and relieves the need to vomit.  GET HELP RIGHT AWAY IF:  Your symptoms do not improve or worsen within 2 days after treatment. You have a fever for over 3 days. You cannot keep down fluids after trying the medication.  MAKE SURE YOU:  Understand these instructions. Will watch your condition. Will get help right away if you are not doing well or get worse.   Thank you for choosing an e-visit.  Your e-visit answers were reviewed by a board certified advanced clinical practitioner to complete your personal care plan. Depending upon the condition, your plan could have included both over the counter or prescription medications.  Please review your pharmacy choice. Make sure the pharmacy is open so you can pick  up prescription now. If there is a problem, you may contact your provider through MyChart messaging and have the prescription routed to another pharmacy.  Your safety is important to us. If you have drug allergies check your prescription carefully.   For the next 24 hours you can use MyChart to ask questions about today's visit, request a non-urgent call back, or ask for a work or school excuse. You will get an email in the next two days asking about your experience. I hope that your e-visit has been valuable and will speed your recovery.  Mary-Margaret Muhanad Torosyan, FNP   5-10 minutes spent reviewing and documenting in chart.  

## 2022-10-22 ENCOUNTER — Ambulatory Visit: Payer: BC Managed Care – PPO | Admitting: Pharmacist

## 2022-10-22 ENCOUNTER — Telehealth: Payer: Self-pay

## 2022-10-22 ENCOUNTER — Other Ambulatory Visit (HOSPITAL_COMMUNITY): Payer: Self-pay

## 2022-10-22 NOTE — Progress Notes (Deleted)
HPI: Brenda Riley is a 31 y.o. female who presents to the [facility] pharmacy clinic for [place] administration.  Patient Active Problem List   Diagnosis Date Noted   Broken tooth 06/24/2022   Preventative health care 01/01/2022   Pelvic pain 10/11/2021   Paresthesia of both hands 03/08/2021   History of suicide attempt 05/20/2016   Severe bipolar I disorder, current or most recent episode depressed 05/20/2016   Anxiety 01/15/2016   Severe episode of recurrent major depressive disorder 12/19/2015   HIV disease 12/18/2015   Suicidal behavior 12/18/2015    Patient's Medications  New Prescriptions   No medications on file  Previous Medications   AMOXICILLIN (AMOXIL) 500 MG CAPSULE    Take 1 capsule (500 mg total) by mouth 3 (three) times daily.   CABOTEGRAVIR & RILPIVIRINE ER (CABENUVA) 600 & 900 MG/3ML INJECTION    Inject 1 kit into the muscle every 2 (two) months.   CHLORHEXIDINE (PERIDEX) 0.12 % SOLUTION    Use as directed 15 mLs in the mouth or throat 2 (two) times daily.   FLUCONAZOLE (DIFLUCAN) 150 MG TABLET    Take 1 tablet PO once. Repeat in 3 days if needed.   FLUOXETINE (PROZAC) 10 MG TABLET    TAKE 1 TABLET BY MOUTH DAILY   NAPROXEN (NAPROSYN) 500 MG TABLET    Take 1 tablet (500 mg total) by mouth 2 (two) times daily with a meal.   ONDANSETRON (ZOFRAN) 4 MG TABLET    Take 1 tablet (4 mg total) by mouth every 8 (eight) hours as needed for nausea or vomiting.  Modified Medications   No medications on file  Discontinued Medications   No medications on file    Allergies: No Known Allergies  Past Medical History: Past Medical History:  Diagnosis Date   Alcohol abuse 05/20/2016   History of suicide attempt 05/20/2016   HIV antibody positive ([facility])    HIV disease ([facility]) 01/15/2016   Multiple personality disorder ([facility]) 05/20/2016    Social History: Social History   Socioeconomic History   Marital status: Significant Other    Spouse name: Not on file   Number of  children: Not on file   Years of education: Not on file   Highest education level: Not on file  Occupational History   Not on file  Tobacco Use   Smoking status: Never   Smokeless tobacco: Never  Substance and Sexual Activity   Alcohol use: Not Currently    Comment: socially   Drug use: Yes    Types: Marijuana   Sexual activity: Not Currently    Partners: Male    Comment: declined condoms  Other Topics Concern   Not on file  Social History Narrative   Not on file   Social Determinants of Health   Financial Resource Strain: Not on file  Food Insecurity: Not on file  Transportation Needs: Not on file  Physical Activity: Not on file  Stress: Not on file  Social Connections: Not on file    Labs: Lab Results  Component Value Date   HIV1RNAQUANT <20 (H) 08/26/2022   HIV1RNAQUANT Not Detected 04/25/2022   HIV1RNAQUANT Not Detected 01/01/2022   CD4TABS 989 08/26/2022   CD4TABS 917 01/01/2022   CD4TABS 430 05/03/2016    RPR and STI Lab Results  Component Value Date   LABRPR NON-REACTIVE 10/29/2021   LABRPR NON-REACTIVE 08/29/2021   LABRPR NON-REACTIVE 03/08/2021   LABRPR NON REAC 05/03/2016   LABRPR NON REAC 01/15/2016  STI Results GC CT  02/20/2022  9:01 AM Negative  Negative   09/05/2021  2:40 PM Negative  Negative   09/05/2021 10:25 AM Negative  Negative   05/20/2016 12:00 AM Negative  Negative     Hepatitis B Lab Results  Component Value Date   HEPBSAB POS (A) 01/15/2016   HEPBSAG NEGATIVE 01/15/2016   Hepatitis C No results found for: "HEPCAB", "HCVRNAPCRQN" Hepatitis A Lab Results  Component Value Date   HAV NON-REACTIVE 04/26/2021   Lipids: Lab Results  Component Value Date   CHOL 175 08/29/2021   TRIG 74 08/29/2021   HDL 45 (L) 08/29/2021   CHOLHDL 3.9 08/29/2021   LDLCALC 113 (H) 08/29/2021    TARGET DATE: The 22nd  Assessment: Brenda Riley presents today for her maintenance Cabenuva injections. Past injections were tolerated well  without issues.  Administered cabotegravir /39mL in left upper outer quadrant of the gluteal muscle. Administered rilpivirine 900 mg/34mL in the right upper outer quadrant of the gluteal muscle. No issues with injections. She will follow up in 2 months for next set of injections.  Plan: - Cabenuva injections administered - Next injections scheduled for *** - Call with any issues or questions  Blane Ohara, PharmD  PGY1 Pharmacy Resident

## 2022-10-22 NOTE — Telephone Encounter (Signed)
[facility] Patient Advocate Encounter   Received notification from [facility] that prior authorization for Brenda Riley is required. (Medical Benefits)   PA submitted on 10/22/22 Key [id number] Status is pending    [facility] Clinic will continue to follow.   Clearance Coots, CPhT Specialty Pharmacy Patient [facility] for Infectious Disease Phone: [phone] Fax:  [phone]

## 2022-10-24 ENCOUNTER — Other Ambulatory Visit: Payer: Self-pay | Admitting: Infectious Diseases

## 2022-10-24 NOTE — Telephone Encounter (Signed)
Okay to refill? 

## 2022-10-28 ENCOUNTER — Telehealth: Payer: Self-pay

## 2022-10-28 NOTE — Telephone Encounter (Signed)
[facility] Patient Advocate Hinda Kehr [phone] & [phone])  Preauthorization is not required.   [employer] [place]  Notification will be in Media  Clearance Coots, CPhT Specialty Pharmacy Patient [facility] for Infectious Disease Phone: [phone] Fax:  [phone]

## 2022-11-07 ENCOUNTER — Other Ambulatory Visit: Payer: Self-pay

## 2022-11-07 ENCOUNTER — Ambulatory Visit (INDEPENDENT_AMBULATORY_CARE_PROVIDER_SITE_OTHER): Payer: BC Managed Care – PPO | Admitting: Pharmacist

## 2022-11-07 DIAGNOSIS — Z23 Encounter for immunization: Secondary | ICD-10-CM

## 2022-11-07 DIAGNOSIS — B20 Human immunodeficiency virus [HIV] disease: Secondary | ICD-10-CM | POA: Diagnosis not present

## 2022-11-07 MED ORDER — CABOTEGRAVIR & RILPIVIRINE ER 600 & 900 MG/3ML IM SUER
1.0000 | Freq: Once | INTRAMUSCULAR | Status: AC
  Administered 2022-11-07: 1 via INTRAMUSCULAR

## 2022-11-07 NOTE — Progress Notes (Signed)
HPI: Brenda Riley is a 32 y.o. female who presents to the [facility] pharmacy clinic for [place] administration.  Patient Active Problem List   Diagnosis Date Noted   Broken tooth 06/24/2022   Preventative health care 01/01/2022   Pelvic pain 10/11/2021   Paresthesia of both hands 03/08/2021   History of suicide attempt 05/20/2016   Severe bipolar I disorder, current or most recent episode depressed (HCC) 05/20/2016   Anxiety 01/15/2016   Severe episode of recurrent major depressive disorder (HCC) 12/19/2015   HIV disease (HCC) 12/18/2015   Suicidal behavior 12/18/2015    Patient's Medications  New Prescriptions   No medications on file  Previous Medications   AMOXICILLIN (AMOXIL) 500 MG CAPSULE    Take 1 capsule (500 mg total) by mouth 3 (three) times daily.   CABOTEGRAVIR & RILPIVIRINE ER (CABENUVA) 600 & 900 MG/3ML INJECTION    Inject 1 kit into the muscle every 2 (two) months.   CHLORHEXIDINE (PERIDEX) 0.12 % SOLUTION    RINSE AND GARGLE 15 ML BY MOUTH OR THROAT TWICE DAILY AS DIRECTED   FLUCONAZOLE (DIFLUCAN) 150 MG TABLET    Take 1 tablet PO once. Repeat in 3 days if needed.   FLUOXETINE (PROZAC) 10 MG TABLET    TAKE 1 TABLET BY MOUTH DAILY   NAPROXEN (NAPROSYN) 500 MG TABLET    Take 1 tablet (500 mg total) by mouth 2 (two) times daily with a meal.   ONDANSETRON (ZOFRAN) 4 MG TABLET    Take 1 tablet (4 mg total) by mouth every 8 (eight) hours as needed for nausea or vomiting.  Modified Medications   No medications on file  Discontinued Medications   No medications on file    Allergies: No Known Allergies  Past Medical History: Past Medical History:  Diagnosis Date   Alcohol abuse 05/20/2016   History of suicide attempt 05/20/2016   HIV antibody positive (HCC)    HIV disease (HCC) 01/15/2016   Multiple personality disorder (HCC) 05/20/2016    Social History: Social History   Socioeconomic History   Marital status: Significant Other    Spouse name: Not on  file   Number of children: Not on file   Years of education: Not on file   Highest education level: Not on file  Occupational History   Not on file  Tobacco Use   Smoking status: Never   Smokeless tobacco: Never  Substance and Sexual Activity   Alcohol use: Not Currently    Comment: socially   Drug use: Yes    Types: Marijuana   Sexual activity: Not Currently    Partners: Male    Comment: declined condoms  Other Topics Concern   Not on file  Social History Narrative   Not on file   Social Determinants of Health   Financial Resource Strain: Not on file  Food Insecurity: Not on file  Transportation Needs: Not on file  Physical Activity: Not on file  Stress: Not on file  Social Connections: Not on file    Labs: Lab Results  Component Value Date   HIV1RNAQUANT <20 (H) 08/26/2022   HIV1RNAQUANT Not Detected 04/25/2022   HIV1RNAQUANT Not Detected 01/01/2022   CD4TABS 989 08/26/2022   CD4TABS 917 01/01/2022   CD4TABS 430 05/03/2016    RPR and STI Lab Results  Component Value Date   LABRPR NON-REACTIVE 10/29/2021   LABRPR NON-REACTIVE 08/29/2021   LABRPR NON-REACTIVE 03/08/2021   LABRPR NON REAC 05/03/2016   LABRPR NON REAC  01/15/2016    STI Results GC CT  02/20/2022  9:01 AM Negative  Negative   09/05/2021  2:40 PM Negative  Negative   09/05/2021 10:25 AM Negative  Negative   05/20/2016 12:00 AM Negative  Negative     Hepatitis B Lab Results  Component Value Date   HEPBSAB POS (A) 01/15/2016   HEPBSAG NEGATIVE 01/15/2016   Hepatitis C No results found for: "HEPCAB", "HCVRNAPCRQN" Hepatitis A Lab Results  Component Value Date   HAV NON-REACTIVE 04/26/2021   Lipids: Lab Results  Component Value Date   CHOL 175 08/29/2021   TRIG 74 08/29/2021   HDL 45 (L) 08/29/2021   CHOLHDL 3.9 08/29/2021   LDLCALC 113 (H) 08/29/2021    TARGET DATE: The 22nd  Assessment: Brenda Riley presents today for her maintenance Cabenuva injections. Past injections were  tolerated well without issues. She is a few days out of her injection window due to insurance issues. She did not let us know of her new insurance and a prior authorization was needed. Advised to always let us know in the future if she has any changes. She is due for her 2nd Menveo today and agrees to receiving it. Her HIV RNA was undetectable and CD4 count was 989 at her last visit so no lab work is needed today. No concerns for any STIs.   She has been experiencing intermittent nausea for the last few weeks. She was prescribed zofran but states that it doesn't help much. Advised her that she may just have a GI illness that is lingering and to keep an eye on it. She agrees.  Administered cabotegravir 600mg /46mL in left upper outer quadrant of the gluteal muscle. Administered rilpivirine 900 mg/37mL in the right upper outer quadrant of the gluteal muscle. No issues with injections. She will follow up in 2 months for next set of injections.  Plan: - Cabenuva injections administered - 2nd Menveo - Next injections scheduled for 12/30/22 with Judeth Cornfield and 02/27/23 with me - Call with any issues or questions  Traniya Prichett L. Sriansh Farra, PharmD, BCIDP, AAHIVP, CPP Clinical Pharmacist Practitioner Infectious Diseases Clinical Pharmacist [facility]

## 2022-11-22 ENCOUNTER — Ambulatory Visit: Payer: BC Managed Care – PPO | Admitting: Family Medicine

## 2022-12-30 ENCOUNTER — Ambulatory Visit: Payer: BC Managed Care – PPO | Admitting: Infectious Diseases

## 2023-01-01 ENCOUNTER — Other Ambulatory Visit: Payer: Self-pay

## 2023-01-01 ENCOUNTER — Encounter: Payer: Self-pay | Admitting: Infectious Diseases

## 2023-01-01 ENCOUNTER — Ambulatory Visit: Payer: BC Managed Care – PPO | Admitting: Family Medicine

## 2023-01-01 ENCOUNTER — Ambulatory Visit (INDEPENDENT_AMBULATORY_CARE_PROVIDER_SITE_OTHER): Payer: BC Managed Care – PPO | Admitting: Infectious Diseases

## 2023-01-01 VITALS — BP 125/73 | HR 2 | Temp 97.8°F | Resp 16 | Wt 181.8 lb

## 2023-01-01 DIAGNOSIS — B20 Human immunodeficiency virus [HIV] disease: Secondary | ICD-10-CM

## 2023-01-01 DIAGNOSIS — Z124 Encounter for screening for malignant neoplasm of cervix: Secondary | ICD-10-CM

## 2023-01-01 DIAGNOSIS — Z0000 Encounter for general adult medical examination without abnormal findings: Secondary | ICD-10-CM

## 2023-01-01 MED ORDER — CABOTEGRAVIR & RILPIVIRINE ER 600 & 900 MG/3ML IM SUER
1.0000 | Freq: Once | INTRAMUSCULAR | Status: AC
  Administered 2023-01-01: 1 via INTRAMUSCULAR

## 2023-01-01 NOTE — Patient Instructions (Addendum)
  Recommend we get your pap smear updated sometime this year - I am happy to help you do that or you can see your gynecology team.   Continue your cabenuva injections - 02/27/2023 - is your next appointment with our pharmacy team.   Will let you know what your test results show from today. Planning to update your labs next injection visit   Enjoy your summer!

## 2023-01-01 NOTE — Assessment & Plan Note (Signed)
Pap smear and breast exam completed.  Mammograms to start at 32 yo.  Statin to start at 32 yo Vaccines are all UTD.

## 2023-01-01 NOTE — Assessment & Plan Note (Signed)
Virologically controlled on Cabenuva Q15m intervals. Continue with Q11m lab monitoring with next labs due in August (orders entered). No concerns with access or adherence to medication. They are tolerating the medication well without side effects. No drug interactions identified. Pertinent lab tests ordered today.  No changes to insurance coverage.  No dental needs today.  No concern over anxious/depressed mood.  Sexual health and family planning discussed - pap smear collected today .  Breast exam completed - no abnormalities. Recommend formal mammogram to start at 32 yo with no immediate family h/o breast cancer.   02/27/2023 is her next injection appointment.

## 2023-01-01 NOTE — Progress Notes (Signed)
Name: Brenda Riley  DOB: 10/13/1990 MRN: [id number] PCP: Patient, No Pcp Per    Brief Narrative:  Brenda Riley is a 32 y.o. female with HIV, Dx 09-2012 CD4 nadir > 200 reported HIV Risk: heterosexual contact History of OIs: none Intake Labs: Hep B sAg (-), sAb (+) 2017, Hep A (), Hep C () Quantiferon () HLA B*5701 () G6PD: ()   Previous Regimens: 2014 - kaletra + combivir  Stribild Tivicay + Truvada 2017 Cabenuva 04-2021  Genotypes: None on file currently.   Subjective:   Chief Complaint  Patient presents with   Follow-up    B20      HPI: Here for 86m follow up for HIV, on Q26m Cabenuva injections for treatments.  Menveo #2 was completed at last OV. She has had all HPV vaccines.  Her son is with her today. Everything is great!  Menses are 2-5 days late the most recent month. Sometimes it's 3 days early. Shortest interval was 2 in one month - but in looking it actually started end of January and ended 2/2 with next starting 2/26 so not as abnormal as she once thought.  BTL for contraception. No condoms otherwise. No symptoms.    Review of Systems  All other systems reviewed and are negative.    Past Medical History:  Diagnosis Date   Alcohol abuse 05/20/2016   History of suicide attempt 05/20/2016   HIV antibody positive (HCC)    HIV disease (HCC) 01/15/2016   Multiple personality disorder (HCC) 05/20/2016    Outpatient Medications Prior to Visit  Medication Sig Dispense Refill   cabotegravir & rilpivirine ER (CABENUVA) 600 & 900 MG/3ML injection Inject 1 kit into the muscle every 2 (two) months. 6 mL 5   FLUoxetine (PROZAC) 10 MG tablet TAKE 1 TABLET BY MOUTH DAILY 30 tablet 5   naproxen (NAPROSYN) 500 MG tablet Take 1 tablet (500 mg total) by mouth 2 (two) times daily with a meal. 30 tablet 0   amoxicillin (AMOXIL) 500 MG capsule Take 1 capsule (500 mg total) by mouth 3 (three) times daily. (Patient not taking: Reported on 06/24/2022) 21  capsule 0   chlorhexidine (PERIDEX) 0.12 % solution RINSE AND GARGLE 15 ML BY MOUTH OR THROAT TWICE DAILY AS DIRECTED (Patient not taking: Reported on 01/01/2023) 1893 mL 0   fluconazole (DIFLUCAN) 150 MG tablet Take 1 tablet PO once. Repeat in 3 days if needed. (Patient not taking: Reported on 06/24/2022) 2 tablet 0   ondansetron (ZOFRAN) 4 MG tablet Take 1 tablet (4 mg total) by mouth every 8 (eight) hours as needed for nausea or vomiting. (Patient not taking: Reported on 01/01/2023) 20 tablet 0   No facility-administered medications prior to visit.     No Known Allergies  Social History   Tobacco Use   Smoking status: Never   Smokeless tobacco: Never  Substance Use Topics   Alcohol use: Not Currently    Comment: socially   Drug use: Yes    Types: Marijuana    No family history on file.  Social History   Substance and Sexual Activity  Sexual Activity Not Currently   Partners: Male   Comment: declined condoms     Objective:   Vitals:   01/01/23 0838  Resp: 16  Temp: 97.8 F (36.6 C)  TempSrc: Oral  Weight: 181 lb 12.8 oz (82.5 kg)    Body mass index is 34.35 kg/m.  Physical Exam Vitals reviewed. Exam conducted with a chaperone  present.  Constitutional:      Appearance: She is well-developed.     Comments: Seated comfortably in chair.   HENT:     Mouth/Throat:     Mouth: No oral lesions.     Dentition: Normal dentition. No dental abscesses.     Pharynx: No oropharyngeal exudate.      Comments: Broken tooth noted without any current signs of infection. She does have a sharp area that is cutting into the gumline I can see.  Cardiovascular:     Rate and Rhythm: Normal rate and regular rhythm.     Heart sounds: Normal heart sounds.  Pulmonary:     Effort: Pulmonary effort is normal.     Breath sounds: Normal breath sounds.  Chest:     Comments: Manual breast exam completed w/o any findings.  Abdominal:     General: There is no distension.     Palpations:  Abdomen is soft.     Tenderness: There is no abdominal tenderness.  Genitourinary:    Pubic Area: No rash.      Vagina: Normal. No vaginal discharge or erythema.     Cervix: Normal.  Lymphadenopathy:     Cervical: No cervical adenopathy.  Skin:    General: Skin is warm and dry.     Findings: No rash.  Neurological:     Mental Status: She is alert and oriented to person, place, and time.  Psychiatric:        Judgment: Judgment normal.     Comments: In good spirits today and engaged in care discussion     Lab Results Lab Results  Component Value Date   WBC 6.4 05/25/2022   HGB 12.8 05/25/2022   HCT 39.5 05/25/2022   MCV 87.8 05/25/2022   PLT 259 05/25/2022    Lab Results  Component Value Date   CREATININE 1.00 05/25/2022   BUN 10 05/25/2022   NA 138 05/25/2022   K 3.8 05/25/2022   CL 108 05/25/2022   CO2 22 05/25/2022    Lab Results  Component Value Date   ALT 9 01/01/2022   AST 13 01/01/2022   ALKPHOS 57 09/05/2021   BILITOT 0.3 01/01/2022    Lab Results  Component Value Date   CHOL 175 08/29/2021   HDL 45 (L) 08/29/2021   LDLCALC 113 (H) 08/29/2021   TRIG 74 08/29/2021   CHOLHDL 3.9 08/29/2021   HIV 1 RNA Quant (Copies/mL)  Date Value  08/26/2022 <20 (H)  04/25/2022 Not Detected  01/01/2022 Not Detected   CD4 T Cell Abs (/uL)  Date Value  08/26/2022 989  01/01/2022 917  05/03/2016 430     Assessment & Plan:   Problem List Items Addressed This Visit       Unprioritized   HIV disease (HCC) - Primary    Virologically controlled on Cabenuva Q42m intervals. Continue with Q80m lab monitoring with next labs due in August (orders entered). No concerns with access or adherence to medication. They are tolerating the medication well without side effects. No drug interactions identified. Pertinent lab tests ordered today.  No changes to insurance coverage.  No dental needs today.  No concern over anxious/depressed mood.  Sexual health and family planning  discussed - pap smear collected today .  Breast exam completed - no abnormalities. Recommend formal mammogram to start at 32 yo with no immediate family h/o breast cancer.   02/27/2023 is her next injection appointment.       Relevant Orders  HIV 1 RNA quant-no reflex-bld   T-helper cells (CD4) count   CBC   RPR   Lipid panel   Preventative health care    Pap smear and breast exam completed.  Mammograms to start at 32 yo.  Statin to start at 32 yo Vaccines are all UTD.       Other Visit Diagnoses     Screening for cervical cancer       Relevant Orders   Cytology - PAP( [place])      Rexene Alberts, MSN, NP-C [facility] Pager: [phone] Office: [phone]  01/01/23  9:12 AM

## 2023-01-01 NOTE — Addendum Note (Signed)
Addended by: Marcell Anger on: 01/01/2023 09:34 AM   Modules accepted: Orders

## 2023-01-07 LAB — CYTOLOGY - PAP
Adequacy: ABNORMAL
Chlamydia: NEGATIVE
Comment: NEGATIVE
Comment: NEGATIVE
Comment: NORMAL
High risk HPV: NEGATIVE
Neisseria Gonorrhea: NEGATIVE

## 2023-02-25 NOTE — Progress Notes (Signed)
HPI: Brenda Riley is a 32 y.o. female who presents to the [facility] pharmacy clinic for [place] administration.  Patient Active Problem List   Diagnosis Date Noted   Broken tooth 06/24/2022   Preventative health care 01/01/2022   Paresthesia of both hands 03/08/2021   History of suicide attempt 05/20/2016   Severe bipolar I disorder, current or most recent episode depressed (HCC) 05/20/2016   Anxiety 01/15/2016   Severe episode of recurrent major depressive disorder (HCC) 12/19/2015   HIV disease (HCC) 12/18/2015   Suicidal behavior 12/18/2015    Patient's Medications  New Prescriptions   No medications on file  Previous Medications   AMOXICILLIN (AMOXIL) 500 MG CAPSULE    Take 1 capsule (500 mg total) by mouth 3 (three) times daily.   CABOTEGRAVIR & RILPIVIRINE ER (CABENUVA) 600 & 900 MG/3ML INJECTION    Inject 1 kit into the muscle every 2 (two) months.   CHLORHEXIDINE (PERIDEX) 0.12 % SOLUTION    RINSE AND GARGLE 15 ML BY MOUTH OR THROAT TWICE DAILY AS DIRECTED   FLUCONAZOLE (DIFLUCAN) 150 MG TABLET    Take 1 tablet PO once. Repeat in 3 days if needed.   FLUOXETINE (PROZAC) 10 MG TABLET    TAKE 1 TABLET BY MOUTH DAILY   NAPROXEN (NAPROSYN) 500 MG TABLET    Take 1 tablet (500 mg total) by mouth 2 (two) times daily with a meal.   ONDANSETRON (ZOFRAN) 4 MG TABLET    Take 1 tablet (4 mg total) by mouth every 8 (eight) hours as needed for nausea or vomiting.  Modified Medications   No medications on file  Discontinued Medications   No medications on file    Allergies: No Known Allergies  Labs: Lab Results  Component Value Date   HIV1RNAQUANT <20 (H) 08/26/2022   HIV1RNAQUANT Not Detected 04/25/2022   HIV1RNAQUANT Not Detected 01/01/2022   CD4TABS 989 08/26/2022   CD4TABS 917 01/01/2022   CD4TABS 430 05/03/2016    RPR and STI Lab Results  Component Value Date   LABRPR NON-REACTIVE 10/29/2021   LABRPR NON-REACTIVE 08/29/2021   LABRPR NON-REACTIVE 03/08/2021    LABRPR NON REAC 05/03/2016   LABRPR NON REAC 01/15/2016    STI Results GC CT  01/01/2023  9:07 AM Negative  Negative   02/20/2022  9:01 AM Negative  Negative   09/05/2021  2:40 PM Negative  Negative   09/05/2021 10:25 AM Negative  Negative   05/20/2016 12:00 AM Negative  Negative     Hepatitis B Lab Results  Component Value Date   HEPBSAB POS (A) 01/15/2016   HEPBSAG NEGATIVE 01/15/2016   Hepatitis C No results found for: "HEPCAB", "HCVRNAPCRQN" Hepatitis A Lab Results  Component Value Date   HAV NON-REACTIVE 04/26/2021   Lipids: Lab Results  Component Value Date   CHOL 175 08/29/2021   TRIG 74 08/29/2021   HDL 45 (L) 08/29/2021   CHOLHDL 3.9 08/29/2021   LDLCALC 113 (H) 08/29/2021    TARGET DATE: The 22nd of the month  Assessment: Brenda Riley presents today for her maintenance Cabenuva injections. Past injections were tolerated well without issues.  Administered cabotegravir 600mg /51mL in left upper outer quadrant of the gluteal muscle. Administered rilpivirine 900 mg/34mL in the right upper outer quadrant of the gluteal muscle. No issues with injections. She will follow up in 2 months for next set of injections.  Politely declined STI testing today, but wants to get it done at next appointment with Judeth Cornfield. Will obtain routine labs  today.   Plan: - Cabenuva injections administered - Obtained HIV RNA, CD4, CBC, RPR and Lipid Panel today  - Next injections scheduled for 10/21 with Judeth Cornfield and 12/17 with Cassie - Call with any issues or questions  Lennie Muckle, PharmD PGY1 Pharmacy Resident 02/25/2023 7:06 PM

## 2023-02-27 ENCOUNTER — Other Ambulatory Visit: Payer: BC Managed Care – PPO

## 2023-02-27 ENCOUNTER — Other Ambulatory Visit: Payer: Self-pay

## 2023-02-27 ENCOUNTER — Ambulatory Visit (INDEPENDENT_AMBULATORY_CARE_PROVIDER_SITE_OTHER): Payer: BC Managed Care – PPO | Admitting: Pharmacist

## 2023-02-27 DIAGNOSIS — Z113 Encounter for screening for infections with a predominantly sexual mode of transmission: Secondary | ICD-10-CM | POA: Diagnosis not present

## 2023-02-27 DIAGNOSIS — B20 Human immunodeficiency virus [HIV] disease: Secondary | ICD-10-CM | POA: Diagnosis not present

## 2023-02-27 MED ORDER — CABOTEGRAVIR & RILPIVIRINE ER 600 & 900 MG/3ML IM SUER
1.0000 | Freq: Once | INTRAMUSCULAR | Status: AC
  Administered 2023-02-27: 1 via INTRAMUSCULAR

## 2023-02-28 LAB — T-HELPER CELLS (CD4) COUNT (NOT AT ARMC)
CD4 % Helper T Cell: 42 % (ref 33–65)
CD4 T Cell Abs: 933 /uL (ref 400–1790)

## 2023-03-01 LAB — CBC
HCT: 39.5 % (ref 35.0–45.0)
Hemoglobin: 12.8 g/dL (ref 11.7–15.5)
MCH: 28.5 pg (ref 27.0–33.0)
MCHC: 32.4 g/dL (ref 32.0–36.0)
MCV: 88 fL (ref 80.0–100.0)
MPV: 12.4 fL (ref 7.5–12.5)
Platelets: 252 10*3/uL (ref 140–400)
RBC: 4.49 10*6/uL (ref 3.80–5.10)
RDW: 12.2 % (ref 11.0–15.0)
WBC: 4.1 10*3/uL (ref 3.8–10.8)

## 2023-03-01 LAB — LIPID PANEL
Cholesterol: 154 mg/dL (ref <200)
HDL: 45 mg/dL — ABNORMAL LOW (ref >=50)
LDL Cholesterol (Calc): 92 mg/dL
Non-HDL Cholesterol (Calc): 109 mg/dL (ref <130)
Total CHOL/HDL Ratio: 3.4 (calc) (ref <5.0)
Triglycerides: 78 mg/dL (ref <150)

## 2023-03-01 LAB — HIV-1 RNA QUANT-NO REFLEX-BLD
HIV 1 RNA Quant: NOT DETECTED {copies}/mL
HIV-1 RNA Quant, Log: NOT DETECTED {Log_copies}/mL

## 2023-03-01 LAB — RPR: RPR Ser Ql: NONREACTIVE

## 2023-03-29 ENCOUNTER — Telehealth: Payer: BC Managed Care – PPO | Admitting: Nurse Practitioner

## 2023-03-29 DIAGNOSIS — L309 Dermatitis, unspecified: Secondary | ICD-10-CM | POA: Diagnosis not present

## 2023-03-29 MED ORDER — TRIAMCINOLONE ACETONIDE 0.1 % EX CREA: 1.0000 | TOPICAL_CREAM | Freq: Two times a day (BID) | CUTANEOUS | 0 refills | Status: AC

## 2023-03-29 NOTE — Progress Notes (Signed)

## 2023-03-29 NOTE — Progress Notes (Signed)
I have spent 5 minutes in review of e-visit questionnaire, review and updating patient chart, medical decision making and response to patient.  ° °Jerrell Mangel W Secilia Apps, NP ° °  °

## 2023-04-28 ENCOUNTER — Ambulatory Visit: Payer: BC Managed Care – PPO | Admitting: Infectious Diseases

## 2023-05-05 ENCOUNTER — Ambulatory Visit (INDEPENDENT_AMBULATORY_CARE_PROVIDER_SITE_OTHER): Payer: BC Managed Care – PPO | Admitting: Pharmacist

## 2023-05-05 ENCOUNTER — Other Ambulatory Visit: Payer: Self-pay

## 2023-05-05 DIAGNOSIS — B20 Human immunodeficiency virus [HIV] disease: Secondary | ICD-10-CM

## 2023-05-05 DIAGNOSIS — Z23 Encounter for immunization: Secondary | ICD-10-CM

## 2023-05-05 MED ORDER — CABOTEGRAVIR & RILPIVIRINE ER 600 & 900 MG/3ML IM SUER
1.0000 | Freq: Once | INTRAMUSCULAR | Status: AC
  Administered 2023-05-05: 1 via INTRAMUSCULAR

## 2023-05-05 NOTE — Progress Notes (Signed)
HPI: Brenda Riley is a 32 y.o. female who presents to the [facility] pharmacy clinic for [place] administration.  Patient Active Problem List   Diagnosis Date Noted   Broken tooth 06/24/2022   Preventative health care 01/01/2022   Paresthesia of both hands 03/08/2021   History of suicide attempt 05/20/2016   Severe bipolar I disorder, current or most recent episode depressed (HCC) 05/20/2016   Anxiety 01/15/2016   Severe episode of recurrent major depressive disorder (HCC) 12/19/2015   HIV disease (HCC) 12/18/2015   Suicidal behavior 12/18/2015    Patient's Medications  New Prescriptions   No medications on file  Previous Medications   AMOXICILLIN (AMOXIL) 500 MG CAPSULE    Take 1 capsule (500 mg total) by mouth 3 (three) times daily.   CABOTEGRAVIR & RILPIVIRINE ER (CABENUVA) 600 & 900 MG/3ML INJECTION    Inject 1 kit into the muscle every 2 (two) months.   CHLORHEXIDINE (PERIDEX) 0.12 % SOLUTION    RINSE AND GARGLE 15 ML BY MOUTH OR THROAT TWICE DAILY AS DIRECTED   FLUCONAZOLE (DIFLUCAN) 150 MG TABLET    Take 1 tablet PO once. Repeat in 3 days if needed.   FLUOXETINE (PROZAC) 10 MG TABLET    TAKE 1 TABLET BY MOUTH DAILY   NAPROXEN (NAPROSYN) 500 MG TABLET    Take 1 tablet (500 mg total) by mouth 2 (two) times daily with a meal.   ONDANSETRON (ZOFRAN) 4 MG TABLET    Take 1 tablet (4 mg total) by mouth every 8 (eight) hours as needed for nausea or vomiting.   TRIAMCINOLONE CREAM (KENALOG) 0.1 %    Apply 1 Application topically 2 (two) times daily.  Modified Medications   No medications on file  Discontinued Medications   No medications on file    Allergies: No Known Allergies  Labs: Lab Results  Component Value Date   HIV1RNAQUANT Not Detected 02/27/2023   HIV1RNAQUANT <20 (H) 08/26/2022   HIV1RNAQUANT Not Detected 04/25/2022   CD4TABS 933 02/27/2023   CD4TABS 989 08/26/2022   CD4TABS 917 01/01/2022    RPR and STI Lab Results  Component Value Date   LABRPR  NON-REACTIVE 02/27/2023   LABRPR NON-REACTIVE 10/29/2021   LABRPR NON-REACTIVE 08/29/2021   LABRPR NON-REACTIVE 03/08/2021   LABRPR NON REAC 05/03/2016    STI Results GC CT  01/01/2023  9:07 AM Negative  Negative   02/20/2022  9:01 AM Negative  Negative   09/05/2021  2:40 PM Negative  Negative   09/05/2021 10:25 AM Negative  Negative   05/20/2016 12:00 AM Negative  Negative     Hepatitis B Lab Results  Component Value Date   HEPBSAB POS (A) 01/15/2016   HEPBSAG NEGATIVE 01/15/2016   Hepatitis C No results found for: "HEPCAB", "HCVRNAPCRQN" Hepatitis A Lab Results  Component Value Date   HAV NON-REACTIVE 04/26/2021   Lipids: Lab Results  Component Value Date   CHOL 154 02/27/2023   TRIG 78 02/27/2023   HDL 45 (L) 02/27/2023   CHOLHDL 3.4 02/27/2023   LDLCALC 92 02/27/2023    TARGET DATE: The 22nd  Assessment: Brenda Riley presents today for her maintenance Cabenuva injections. Past injections were tolerated well without issues. Last HIV RNA was undetectable in August. Missed appointment with Judeth Cornfield this month so will reschedule for December. Agrees to receive the flu shot today but declines covid.   Administered cabotegravir 600mg /15mL in left upper outer quadrant of the gluteal muscle. Administered rilpivirine 900 mg/72mL in the right upper outer  quadrant of the gluteal muscle. No issues with injections. She will follow up in 2 months for next set of injections.  Plan: - Cabenuva injections administered - Flu shot administered today - Next injections scheduled for 06/23/23 with Judeth Cornfield and 08/25/23 with me - Call with any issues or questions  Talia Hoheisel L. Khan Chura, PharmD, BCIDP, AAHIVP, CPP Clinical Pharmacist Practitioner Infectious Diseases Clinical Pharmacist [facility]

## 2023-05-29 ENCOUNTER — Telehealth: Payer: BC Managed Care – PPO | Admitting: Physician Assistant

## 2023-05-29 DIAGNOSIS — B379 Candidiasis, unspecified: Secondary | ICD-10-CM | POA: Diagnosis not present

## 2023-05-29 MED ORDER — FLUCONAZOLE 150 MG PO TABS: ORAL_TABLET | ORAL | 0 refills | Status: DC

## 2023-05-29 NOTE — Progress Notes (Signed)
Hi Brenda Riley,  E-Visit for Vaginal Symptoms  We are sorry that you are not feeling well. Here is how we plan to help! Based on what you shared with me it looks like you: May have a yeast vaginosis  Vaginosis is an inflammation of the vagina that can result in discharge, itching and pain. The cause is usually a change in the normal balance of vaginal bacteria or an infection. Vaginosis can also result from reduced estrogen levels after menopause.  The most common causes of vaginosis are:   Bacterial vaginosis which results from an overgrowth of one on several organisms that are normally present in your vagina.   Yeast infections which are caused by a naturally occurring fungus called candida.   Vaginal atrophy (atrophic vaginosis) which results from the thinning of the vagina from reduced estrogen levels after menopause.   Trichomoniasis which is caused by a parasite and is commonly transmitted by sexual intercourse.  Factors that increase your risk of developing vaginosis include: Medications, such as antibiotics and steroids Uncontrolled diabetes Use of hygiene products such as bubble bath, vaginal spray or vaginal deodorant Douching Wearing damp or tight-fitting clothing Using an intrauterine device (IUD) for birth control Hormonal changes, such as those associated with pregnancy, birth control pills or menopause Sexual activity Having a sexually transmitted infection  Your treatment plan is A Diflucan (fluconazole) 150mg  tablet once, but if symptoms don't improve then take the second tablet 72 hours later.  I have electronically sent this prescription into the pharmacy that you have chosen.  Be sure to take all of the medication as directed. Stop taking any medication if you develop a rash, tongue swelling or shortness of breath. Mothers who are breast feeding should consider pumping and discarding their breast milk while on these antibiotics. However, there is no consensus that  infant exposure at these doses would be harmful.  Remember that medication creams can weaken latex condoms. Marland Kitchen   HOME CARE:  Good hygiene may prevent some types of vaginosis from recurring and may relieve some symptoms:  Avoid baths, hot tubs and whirlpool spas. Rinse soap from your outer genital area after a shower, and dry the area well to prevent irritation. Don't use scented or harsh soaps, such as those with deodorant or antibacterial action. Avoid irritants. These include scented tampons and pads. Wipe from front to back after using the toilet. Doing so avoids spreading fecal bacteria to your vagina.  Other things that may help prevent vaginosis include:  Don't douche. Your vagina doesn't require cleansing other than normal bathing. Repetitive douching disrupts the normal organisms that reside in the vagina and can actually increase your risk of vaginal infection. Douching won't clear up a vaginal infection. Use a latex condom. Both female and female latex condoms may help you avoid infections spread by sexual contact. Wear cotton underwear. Also wear pantyhose with a cotton crotch. If you feel comfortable without it, skip wearing underwear to bed. Yeast thrives in [employer] Your symptoms should improve in the next day or two.  GET HELP RIGHT AWAY IF:  You have pain in your lower abdomen ( pelvic area or over your ovaries) You develop nausea or vomiting You develop a fever Your discharge changes or worsens You have persistent pain with intercourse You develop shortness of breath, a rapid pulse, or you faint.  These symptoms could be signs of problems or infections that need to be evaluated by a medical provider now.  MAKE SURE YOU  Understand these instructions. Will watch your condition. Will get help right away if you are not doing well or get worse.  Thank you for choosing an e-visit.  Your e-visit answers were reviewed by a board certified advanced clinical  practitioner to complete your personal care plan. Depending upon the condition, your plan could have included both over the counter or prescription medications.  Please review your pharmacy choice. Make sure the pharmacy is open so you can pick up prescription now. If there is a problem, you may contact your provider through [employer] and have the prescription routed to another pharmacy.  Your safety is important to [place]. If you have drug allergies check your prescription carefully.   For the next 24 hours you can use MyChart to ask questions about today's visit, request a non-urgent call back, or ask for a work or school excuse. You will get an email in the next two days asking about your experience. I hope that your e-visit has been valuable and will speed your recovery.   I have spent 5 minutes in review of e-visit questionnaire, review and updating patient chart, medical decision making and response to patient.   Gilberto Better, PA-C

## 2023-06-23 ENCOUNTER — Ambulatory Visit: Payer: BC Managed Care – PPO | Admitting: Infectious Diseases

## 2023-06-24 ENCOUNTER — Ambulatory Visit: Payer: Self-pay | Admitting: Pharmacist

## 2023-06-27 ENCOUNTER — Other Ambulatory Visit: Payer: Self-pay | Admitting: Pharmacist

## 2023-06-27 ENCOUNTER — Telehealth: Payer: Self-pay

## 2023-06-27 ENCOUNTER — Other Ambulatory Visit (HOSPITAL_COMMUNITY): Payer: Self-pay

## 2023-06-27 DIAGNOSIS — B20 Human immunodeficiency virus [HIV] disease: Secondary | ICD-10-CM

## 2023-06-27 MED ORDER — BICTEGRAVIR-EMTRICITAB-TENOFOV 50-200-25 MG PO TABS: 1.0000 | ORAL_TABLET | Freq: Every day | ORAL | 0 refills | Status: DC

## 2023-06-27 NOTE — Telephone Encounter (Addendum)
[facility] Patient Advocate Encounter   Received notification from [employer] that prior authorization for Brenda Riley is required.   PA submitted on 06/27/23 Key [id number] Status is pending    [facility] Clinic will continue to follow.   Clearance Coots, CPhT Specialty Pharmacy Patient [facility] for Infectious Disease Phone: [phone] Fax:  [phone]

## 2023-06-27 NOTE — Progress Notes (Signed)
Sending Biktarvy bridge in to last until she can see Judeth Cornfield for Boissevain injections next month. She missed her injection last week, and we have no more appointments within her window due to the holidays. Sent Radio producer to [facility] on [address]. Lupita Leash started PA for coverage. Wouldn't need to start taking until 12/29.  Margarite Gouge, PharmD, CPP, BCIDP, AAHIVP Clinical Pharmacist Practitioner Infectious Diseases Clinical Pharmacist [facility]

## 2023-07-03 ENCOUNTER — Other Ambulatory Visit (HOSPITAL_COMMUNITY): Payer: Self-pay

## 2023-07-04 ENCOUNTER — Other Ambulatory Visit: Payer: Self-pay

## 2023-07-04 ENCOUNTER — Telehealth: Payer: Self-pay

## 2023-07-04 ENCOUNTER — Other Ambulatory Visit (HOSPITAL_COMMUNITY): Payer: Self-pay

## 2023-07-04 ENCOUNTER — Other Ambulatory Visit: Payer: Self-pay | Admitting: Pharmacist

## 2023-07-04 DIAGNOSIS — B20 Human immunodeficiency virus [HIV] disease: Secondary | ICD-10-CM

## 2023-07-04 MED ORDER — CABOTEGRAVIR & RILPIVIRINE ER 600 & 900 MG/3ML IM SUER
1.0000 | INTRAMUSCULAR | 5 refills | Status: DC
  Filled 2023-07-04: qty 6, 60d supply, fill #0
  Filled 2023-08-12: qty 6, 56d supply, fill #1
  Filled 2023-10-08: qty 6, 56d supply, fill #2
  Filled 2023-12-16: qty 6, 56d supply, fill #3
  Filled 2024-02-09: qty 6, 56d supply, fill #4
  Filled 2024-04-16: qty 6, 56d supply, fill #5

## 2023-07-04 NOTE — Telephone Encounter (Signed)
Pharmacy Patient Advocate Encounter- Cabenuva BIV-Pharmacy Benefit:  PA was submitted to [employer] and has been approved through: 06/03/23-07/02/24 Authorization# [id number]  Please send prescription to Specialty Pharmacy: [facility]: [phone]  Estimated Copay is: 0.00

## 2023-07-04 NOTE — Progress Notes (Signed)
Specialty Pharmacy Initial Fill Coordination Note  Brenda Riley is a 31 y.o. female contacted today regarding initial fill of specialty medication(s) Cabotegravir & Rilpivirine [facility])   Patient requested Courier to Provider Office   Delivery date: 07/10/23   Verified address: [address] [place] [address]   Medication will be filled on 07/08/23.   Patient is aware of 0.00 copayment.

## 2023-07-08 ENCOUNTER — Other Ambulatory Visit: Payer: Self-pay

## 2023-07-10 ENCOUNTER — Telehealth: Payer: Self-pay

## 2023-07-10 NOTE — Telephone Encounter (Signed)
 [facility] Patient Advocate Encounter  Patient's medications CABENUVA  have been couriered to [facility] from [facility] and will be administered at the patients appointment on 07/18/23.  [name], CPhT Specialty Pharmacy Patient [facility] for Infectious Disease Phone: [phone] Fax:  [phone]

## 2023-07-11 ENCOUNTER — Telehealth: Payer: BC Managed Care – PPO | Admitting: Family Medicine

## 2023-07-11 ENCOUNTER — Encounter: Payer: Self-pay | Admitting: Family Medicine

## 2023-07-11 DIAGNOSIS — R112 Nausea with vomiting, unspecified: Secondary | ICD-10-CM

## 2023-07-11 MED ORDER — ONDANSETRON 4 MG PO TBDP: 4.0000 mg | ORAL_TABLET | Freq: Three times a day (TID) | ORAL | 0 refills | Status: DC | PRN

## 2023-07-11 NOTE — Progress Notes (Signed)
 E-Visit for Nausea and Vomiting   We are sorry that you are not feeling well. Here is how we plan to help!  Based on what you have shared with me it looks like you have a Virus that is irritating your GI tract.  Vomiting is the forceful emptying of a portion of the stomach's content through the mouth.  Although nausea and vomiting can make you feel miserable, it's important to remember that these are not diseases, but rather symptoms of an underlying illness.  When we treat short term symptoms, we always caution that any symptoms that persist should be fully evaluated in a medical office.  I have prescribed a medication that will help alleviate your symptoms and allow you to stay hydrated:  Zofran 4 mg 1 tablet every 8 hours as needed for nausea and vomiting  HOME CARE: Drink clear liquids.  This is very important! Dehydration (the lack of fluid) can lead to a serious complication.  Start off with 1 tablespoon every 5 minutes for 8 hours. You may begin eating bland foods after 8 hours without vomiting.  Start with saltine crackers, white bread, rice, mashed potatoes, applesauce. After 48 hours on a bland diet, you may resume a normal diet. Try to go to sleep.  Sleep often empties the stomach and relieves the need to vomit.  GET HELP RIGHT AWAY IF:  Your symptoms do not improve or worsen within 2 days after treatment. You have a fever for over 3 days. You cannot keep down fluids after trying the medication.  MAKE SURE YOU:  Understand these instructions. Will watch your condition. Will get help right away if you are not doing well or get worse.    Thank you for choosing an e-visit.  Your e-visit answers were reviewed by a board certified advanced clinical practitioner to complete your personal care plan. Depending upon the condition, your plan could have included both over the counter or prescription medications.  Please review your pharmacy choice. Make sure the pharmacy is open so  you can pick up prescription now. If there is a problem, you may contact your provider through [employer] and have the prescription routed to another pharmacy.  Your safety is important to [place]. If you have drug allergies check your prescription carefully.   For the next 24 hours you can use MyChart to ask questions about today's visit, request a non-urgent call back, or ask for a work or school excuse. You will get an email in the next two days asking about your experience. I hope that your e-visit has been valuable and will speed your recovery.  I provided 5 minutes of non face-to-face time during this encounter for chart review, medication and order placement, as well as and documentation.

## 2023-07-16 ENCOUNTER — Other Ambulatory Visit: Payer: Self-pay | Admitting: Pharmacist

## 2023-07-16 ENCOUNTER — Telehealth: Payer: Self-pay | Admitting: Pharmacist

## 2023-07-16 NOTE — Progress Notes (Signed)
 error

## 2023-07-18 ENCOUNTER — Encounter: Payer: Self-pay | Admitting: Infectious Diseases

## 2023-07-18 ENCOUNTER — Other Ambulatory Visit: Payer: Self-pay

## 2023-07-18 ENCOUNTER — Ambulatory Visit: Payer: BC Managed Care – PPO | Admitting: Infectious Diseases

## 2023-07-18 VITALS — BP 106/62 | HR 80 | Temp 97.5°F | Ht 61.0 in | Wt 173.0 lb

## 2023-07-18 DIAGNOSIS — B20 Human immunodeficiency virus [HIV] disease: Secondary | ICD-10-CM | POA: Diagnosis not present

## 2023-07-18 DIAGNOSIS — R110 Nausea: Secondary | ICD-10-CM

## 2023-07-18 MED ORDER — ONDANSETRON HCL 4 MG PO TABS: 4.0000 mg | ORAL_TABLET | Freq: Three times a day (TID) | ORAL | 0 refills | Status: AC | PRN

## 2023-07-18 MED ORDER — CABOTEGRAVIR & RILPIVIRINE ER 600 & 900 MG/3ML IM SUER
1.0000 | Freq: Once | INTRAMUSCULAR | Status: AC
  Administered 2023-07-18: 1 via INTRAMUSCULAR

## 2023-07-18 NOTE — Progress Notes (Signed)
 Name: Brenda Riley  DOB: [birth year] MRN: [id number] PCP: Patient, No Pcp Per    Brief Narrative:  Brenda Riley is a 33 y.o. female with HIV, Dx 09-2012 CD4 nadir > 200 reported HIV Risk: heterosexual contact History of OIs: none Intake Labs: Hep B sAg (-), sAb (+) 2017, Hep A (), Hep C () Quantiferon () HLA B*5701 () G6PD: ()   Previous Regimens: 2014 - kaletra + combivir  Stribild Tivicay  + Truvada 2017 Cabenuva  04-2021  Genotypes: None on file currently.  Subjective   Subjective:   Chief Complaint  Patient presents with   Follow-up      ROS   Past Medical History:  Diagnosis Date   Alcohol abuse 05/20/2016   History of suicide attempt 05/20/2016   HIV antibody positive (HCC)    HIV disease (HCC) 01/15/2016   Multiple personality disorder (HCC) 05/20/2016    Outpatient Medications Prior to Visit  Medication Sig Dispense Refill   cabotegravir  & rilpivirine  ER (CABENUVA ) 600 & 900 MG/3ML injection Inject 1 kit into the muscle every 2 (two) months. 6 mL 5   naproxen  (NAPROSYN ) 500 MG tablet Take 1 tablet (500 mg total) by mouth 2 (two) times daily with a meal. 30 tablet 0   ondansetron  (ZOFRAN -ODT) 4 MG disintegrating tablet Take 1 tablet (4 mg total) by mouth every 8 (eight) hours as needed for nausea or vomiting. 20 tablet 0   triamcinolone  cream (KENALOG ) 0.1 % Apply 1 Application topically 2 (two) times daily. 60 g 0   bictegravir-emtricitabine -tenofovir  AF (BIKTARVY ) 50-200-25 MG TABS tablet Take 1 tablet by mouth daily. 30 tablet 0   chlorhexidine  (PERIDEX ) 0.12 % solution RINSE AND GARGLE 15 ML BY MOUTH OR THROAT TWICE DAILY AS DIRECTED (Patient not taking: Reported on 07/18/2023) 1893 mL 0   FLUoxetine  (PROZAC ) 10 MG tablet TAKE 1 TABLET BY MOUTH DAILY (Patient not taking: Reported on 07/18/2023) 30 tablet 5   amoxicillin  (AMOXIL ) 500 MG capsule Take 1 capsule (500 mg total) by mouth 3 (three) times daily. (Patient not taking: Reported on  07/18/2023) 21 capsule 0   fluconazole  (DIFLUCAN ) 150 MG tablet Take one tablet by mouth x 1 day. May repeat in 72 hours if symptoms don't improve. (Patient not taking: Reported on 07/18/2023) 2 tablet 0   ondansetron  (ZOFRAN ) 4 MG tablet Take 1 tablet (4 mg total) by mouth every 8 (eight) hours as needed for nausea or vomiting. (Patient not taking: Reported on 07/18/2023) 20 tablet 0   No facility-administered medications prior to visit.     No Known Allergies  Social History   Tobacco Use   Smoking status: Never   Smokeless tobacco: Never  Substance Use Topics   Alcohol use: Not Currently    Comment: socially   Drug use: Yes    Types: Marijuana    No family history on file.  Social History   Substance and Sexual Activity  Sexual Activity Not Currently   Partners: Male     Objective    Objective:   Vitals:   07/18/23 1008  BP: 106/62  Pulse: 80  Temp: (!) 97.5 F (36.4 C)  TempSrc: Temporal  SpO2: 100%  Weight: 173 lb (78.5 kg)  Height: 5' 1 (1.549 m)    Body mass index is 32.69 kg/m.  Physical Exam  Lab Results Lab Results  Component Value Date   WBC 4.1 02/27/2023   HGB 12.8 02/27/2023   HCT 39.5 02/27/2023   MCV 88.0 02/27/2023  PLT 252 02/27/2023    Lab Results  Component Value Date   CREATININE 1.00 05/25/2022   BUN 10 05/25/2022   NA 138 05/25/2022   K 3.8 05/25/2022   CL 108 05/25/2022   CO2 22 05/25/2022    Lab Results  Component Value Date   ALT 9 01/01/2022   AST 13 01/01/2022   ALKPHOS 57 09/05/2021   BILITOT 0.3 01/01/2022    Lab Results  Component Value Date   CHOL 154 02/27/2023   HDL 45 (L) 02/27/2023   LDLCALC 92 02/27/2023   TRIG 78 02/27/2023   CHOLHDL 3.4 02/27/2023   HIV 1 RNA Quant (Copies/mL)  Date Value  02/27/2023 Not Detected  08/26/2022 <20 (H)  04/25/2022 Not Detected   CD4 T Cell Abs (/uL)  Date Value  02/27/2023 933  08/26/2022 989  01/01/2022 917     Assessment & Plan:     HIV -   Cabenuva  600/900 mg dose given IM today. She does not need to be reloaded at this point. Will keep her TD 22nd of EVEN months. VL in December undetectable. Will repeat at February appt due to delay in Cab dose (she continued on oral medication as instructed during delay)  -Discontinue Biktarvy  after today. -Continue Cabenuva  injections, next scheduled for 08/25/2023.  Nausea -  Likely secondary to Biktarvy . Vomiting and diarrhea that started this week may have been GI viral illness.  -Prescribe Zofran  4mg  tablets as needed for nausea, to be picked up at [facility] on [address]. -Work note provided for time missed from work during GI illness.   Pelvic Pain - H/O cysts in the past, due for pap smear also. -Refer to [facility] for gynecological evaluation and Pap smear.  Follow-up - Next appointment scheduled for 08/25/2023 for Cabenuva  injection.     Meds ordered this encounter  Medications   cabotegravir  & rilpivirine  ER (CABENUVA ) 600 & 900 MG/3ML injection 1 kit   ondansetron  (ZOFRAN ) 4 MG tablet    Sig: Take 1 tablet (4 mg total) by mouth every 8 (eight) hours as needed for nausea or vomiting.    Dispense:  20 tablet    Refill:  0   No orders of the defined types were placed in this encounter.  Next Cabenuva  appointment: 08/25/2023   Corean Fireman, MSN, NP-C [facility] Pager: [phone] Office: [phone]  07/18/23  11:50 AM

## 2023-07-18 NOTE — Patient Instructions (Addendum)
 [facility] -  Address: [address], [place], [place] [address] Phone: [phone]  If you look like you may miss your appointment, give us  a call 48 hours before your appointment to see if we can assist with transportation.   STOP your biktarvy  today  Will check your viral load next office visit for your injection on 08/25/2023.    [facility]  988: can call or text 24/7  [facility]: Address: [address], [place], [place] [address] Open 24 hours Phone: [phone]  [facility] [place]: Address: [address], [place], [place] [address] Phone: [phone] Appointments: [email or web address]

## 2023-07-22 NOTE — Telephone Encounter (Signed)
 Brenda Riley called and stated that Biktarvy  has been making her nauseous. She has also vomited a few times. She is taking it to bridge to her next injection appointment on 07/18/23. Told her to stop Biktarvy  and to make sure to come to her appointment on Friday to see Corean and resume Cabenuva  injections. She agrees.  Rubee Vega L. Leodan Bolyard, PharmD, BCIDP, AAHIVP, CPP Clinical Pharmacist Practitioner Infectious Diseases Clinical Pharmacist [facility] 07/22/2023, 2:03 PM

## 2023-08-12 ENCOUNTER — Other Ambulatory Visit (HOSPITAL_COMMUNITY): Payer: Self-pay

## 2023-08-12 ENCOUNTER — Other Ambulatory Visit: Payer: Self-pay

## 2023-08-12 NOTE — Progress Notes (Signed)
 Specialty Pharmacy Refill Coordination Note  Brenda Riley is a 33 y.o. female assessed today regarding refills of clinic administered specialty medication(s) Cabotegravir  & Rilpivirine  (CABENUVA )   Clinic requested Courier to Provider Office   Delivery date: 08/22/23   Verified address: [address] [id number]   Medication will be filled on 08/21/23.

## 2023-08-21 ENCOUNTER — Other Ambulatory Visit: Payer: Self-pay

## 2023-08-21 ENCOUNTER — Telehealth: Payer: BC Managed Care – PPO | Admitting: Physician Assistant

## 2023-08-21 ENCOUNTER — Telehealth: Payer: BC Managed Care – PPO | Admitting: Family Medicine

## 2023-08-21 ENCOUNTER — Encounter: Payer: Self-pay | Admitting: Physician Assistant

## 2023-08-21 ENCOUNTER — Other Ambulatory Visit (HOSPITAL_COMMUNITY): Payer: Self-pay

## 2023-08-21 DIAGNOSIS — R079 Chest pain, unspecified: Secondary | ICD-10-CM

## 2023-08-21 DIAGNOSIS — R112 Nausea with vomiting, unspecified: Secondary | ICD-10-CM | POA: Diagnosis not present

## 2023-08-21 DIAGNOSIS — K920 Hematemesis: Secondary | ICD-10-CM

## 2023-08-21 DIAGNOSIS — K529 Noninfective gastroenteritis and colitis, unspecified: Secondary | ICD-10-CM | POA: Diagnosis not present

## 2023-08-21 DIAGNOSIS — R0602 Shortness of breath: Secondary | ICD-10-CM

## 2023-08-21 MED ORDER — ONDANSETRON 8 MG PO TBDP: 8.0000 mg | ORAL_TABLET | Freq: Three times a day (TID) | ORAL | 0 refills | Status: AC | PRN

## 2023-08-21 NOTE — Progress Notes (Signed)
  Because of noted chest pain and shortness of breath along with these symptoms and need for examination, I feel your condition warrants further evaluation and I recommend that you be seen in a face-to-face visit.   NOTE: There will be NO CHARGE for this E-Visit   If you are having a true medical emergency, please call 911.     For an urgent face to face visit, [place] has multiple urgent care centers for your convenience.  Click the link below for the full list of locations and hours, walk-in wait times, appointment scheduling options and driving directions:  Urgent Care - [place], [place], [place], [place], [place], [place]  [place]     Your MyChart E-visit questionnaire answers were reviewed by a board certified advanced clinical practitioner to complete your personal care plan based on your specific symptoms.    Thank you for using e-Visits.

## 2023-08-21 NOTE — Progress Notes (Signed)
  Because you have already been instructed to seek an in-person evaluation, and because in this new questionnaire you are noting red/maroon pieces in your vomiting which is very concerning, I feel your condition warrants further evaluation and I recommend that you be seen in a face-to-face visit.   NOTE: There will be NO CHARGE for this E-Visit   If you are having a true medical emergency, please call 911.     For an urgent face to face visit, [place] has multiple urgent care centers for your convenience.  Click the link below for the full list of locations and hours, walk-in wait times, appointment scheduling options and driving directions:  Urgent Care - [place], [place], [place], [place], [place], [place]  [place]     Your MyChart E-visit questionnaire answers were reviewed by a board certified advanced clinical practitioner to complete your personal care plan based on your specific symptoms.    Thank you for using e-Visits.

## 2023-08-21 NOTE — Progress Notes (Signed)
Virtual Visit Consent   Brenda Riley, you are scheduled for a virtual visit with a [facility] provider today. Just as with appointments in the office, your consent must be obtained to participate. Your consent will be active for this visit and any virtual visit you may have with one of our providers in the next 365 days. If you have a MyChart account, a copy of this consent can be sent to you electronically.  As this is a virtual visit, video technology does not allow for your provider to perform a traditional examination. This may limit your provider's ability to fully assess your condition. If your provider identifies any concerns that need to be evaluated in person or the need to arrange testing (such as labs, EKG, etc.), we will make arrangements to do so. Although advances in technology are sophisticated, we cannot ensure that it will always work on either your end or our end. If the connection with a video visit is poor, the visit may have to be switched to a telephone visit. With either a video or telephone visit, we are not always able to ensure that we have a secure connection.  By engaging in this virtual visit, you consent to the provision of healthcare and authorize for your insurance to be billed (if applicable) for the services provided during this visit. Depending on your insurance coverage, you may receive a charge related to this service.  I need to obtain your verbal consent now. Are you willing to proceed with your visit today? Brenda Riley has provided verbal consent on 08/21/2023 for a virtual visit (video or telephone). Gilberto Better, [place]  Date: 08/21/2023 5:03 PM   Virtual Visit via Video Note   I, Brenda Riley, connected with  Brenda Riley  ([id number], July 12, 1990) on 08/21/23 at  5:00 PM EST by a video-enabled telemedicine application and verified that I am speaking with the correct person using two identifiers.  Location: Patient: Virtual Visit Location  Patient: Home Provider: Virtual Visit Location Provider: Home Office   I discussed the limitations of evaluation and management by telemedicine and the availability of in person appointments. The patient expressed understanding and agreed to proceed.    History of Present Illness: Brenda Riley is a 33 y.o. who identifies as a female who was assigned female at birth, and is being seen today for nausea, vomiting, and diarrhea.  HPI: 33 y/o F presents to the clinic via video telehealth for c/o feeling warm, nausea, vomiting, diarrhea x earlier today. She denies unusual diet. Denies international travel recently. No fever. +family member with similar symptoms this past weekend.   Abdominal Pain    Problems:  Patient Active Problem List   Diagnosis Date Noted   Broken tooth 06/24/2022   Preventative health care 01/01/2022   Paresthesia of both hands 03/08/2021   History of suicide attempt 05/20/2016   Severe bipolar I disorder, current or most recent episode depressed (HCC) 05/20/2016   Anxiety 01/15/2016   Severe episode of recurrent major depressive disorder (HCC) 12/19/2015   HIV disease (HCC) 12/18/2015   Suicidal behavior 12/18/2015    Allergies: No Known Allergies Medications:  Current Outpatient Medications:    ondansetron (ZOFRAN-ODT) 8 MG disintegrating tablet, Take 1 tablet (8 mg total) by mouth every 8 (eight) hours as needed for nausea or vomiting., Disp: 10 tablet, Rfl: 0   cabotegravir & rilpivirine ER (CABENUVA) 600 & 900 MG/3ML injection, Inject 1 kit into the muscle every 2 (two) months.,  Disp: 6 mL, Rfl: 5   chlorhexidine (PERIDEX) 0.12 % solution, RINSE AND GARGLE 15 ML BY MOUTH OR THROAT TWICE DAILY AS DIRECTED (Patient not taking: Reported on 07/18/2023), Disp: 1893 mL, Rfl: 0   FLUoxetine (PROZAC) 10 MG tablet, TAKE 1 TABLET BY MOUTH DAILY (Patient not taking: Reported on 07/18/2023), Disp: 30 tablet, Rfl: 5   naproxen (NAPROSYN) 500 MG tablet, Take 1 tablet (500  mg total) by mouth 2 (two) times daily with a meal., Disp: 30 tablet, Rfl: 0   ondansetron (ZOFRAN) 4 MG tablet, Take 1 tablet (4 mg total) by mouth every 8 (eight) hours as needed for nausea or vomiting., Disp: 20 tablet, Rfl: 0   triamcinolone cream (KENALOG) 0.1 %, Apply 1 Application topically 2 (two) times daily., Disp: 60 g, Rfl: 0  Observations/Objective: Patient is well-developed, well-nourished in no acute distress.  Resting comfortably  at home.  Head is normocephalic, atraumatic.  No labored breathing.  Speech is clear and coherent with logical content.  Patient is alert and oriented at baseline.    Assessment and Plan: 1. Gastroenteritis (Primary)  2. Nausea and vomiting, unspecified vomiting type - ondansetron (ZOFRAN-ODT) 8 MG disintegrating tablet; Take 1 tablet (8 mg total) by mouth every 8 (eight) hours as needed for nausea or vomiting.  Dispense: 10 tablet; Refill: 0  Increase fluids Take Ondansetron medicine as prescribed for nausea and vomiting. Start over the counter Imodium for diarrhea.  Start a [employer] with toast, applesauce, bananas, etc. If tolerated then advance to regular diet. Do not eat undercooked meats or raw fish until symptoms completely resolve.  Follow up with PCP or go to UC if symptoms continue or worsen. Pt verbalized understanding and in agreement.    Follow Up Instructions: I discussed the assessment and treatment plan with the patient. The patient was provided an opportunity to ask questions and all were answered. The patient agreed with the plan and demonstrated an understanding of the instructions.  A copy of instructions were sent to the patient via MyChart unless otherwise noted below.   Patient has requested to receive PHI (AVS, Work Notes, etc) pertaining to this video visit through e-mail as they are currently without active MyChart. They have voiced understand that email is not considered secure and their health information could be  viewed by someone other than the patient.   The patient was advised to call back or seek an in-person evaluation if the symptoms worsen or if the condition fails to improve as anticipated.    Gilberto Better, PA-C

## 2023-08-21 NOTE — Progress Notes (Signed)
  Because you have mention chest pain shortness of breath earlier, we feel your condition warrants further evaluation and we recommend that you be seen in a face-to-face visit.  We must go off the answers you provided in order to provide the safest care.   NOTE: There will be NO CHARGE for this E-Visit

## 2023-08-21 NOTE — Patient Instructions (Addendum)
Brenda Riley, thank you for joining Gilberto Better, PA-C for today's virtual visit.  While this provider is not your primary care provider (PCP), if your PCP is located in our provider database this encounter information will be shared with them immediately following your visit.   A [facility] MyChart account gives you access to today's visit and all your visits, tests, and labs performed at [facility] " click here if you don't have a [facility] MyChart account or go to [email or web address]  Consent: (Patient) Brenda Riley provided verbal consent for this virtual visit at the beginning of the encounter.  Current Medications:  Current Outpatient Medications:    ondansetron (ZOFRAN-ODT) 8 MG disintegrating tablet, Take 1 tablet (8 mg total) by mouth every 8 (eight) hours as needed for nausea or vomiting., Disp: 10 tablet, Rfl: 0   cabotegravir & rilpivirine ER (CABENUVA) 600 & 900 MG/3ML injection, Inject 1 kit into the muscle every 2 (two) months., Disp: 6 mL, Rfl: 5   chlorhexidine (PERIDEX) 0.12 % solution, RINSE AND GARGLE 15 ML BY MOUTH OR THROAT TWICE DAILY AS DIRECTED (Patient not taking: Reported on 07/18/2023), Disp: 1893 mL, Rfl: 0   FLUoxetine (PROZAC) 10 MG tablet, TAKE 1 TABLET BY MOUTH DAILY (Patient not taking: Reported on 07/18/2023), Disp: 30 tablet, Rfl: 5   naproxen (NAPROSYN) 500 MG tablet, Take 1 tablet (500 mg total) by mouth 2 (two) times daily with a meal., Disp: 30 tablet, Rfl: 0   ondansetron (ZOFRAN) 4 MG tablet, Take 1 tablet (4 mg total) by mouth every 8 (eight) hours as needed for nausea or vomiting., Disp: 20 tablet, Rfl: 0   triamcinolone cream (KENALOG) 0.1 %, Apply 1 Application topically 2 (two) times daily., Disp: 60 g, Rfl: 0   Medications ordered in this encounter:  Meds ordered this encounter  Medications   ondansetron (ZOFRAN-ODT) 8 MG disintegrating tablet    Sig: Take 1 tablet (8 mg total) by mouth every 8 (eight) hours as  needed for nausea or vomiting.    Dispense:  10 tablet    Refill:  0    Supervising Provider:   Merrilee Jansky [[id number]]     *If you need refills on other medications prior to your next appointment, please contact your pharmacy*  Follow-Up: Call back or seek an in-person evaluation if the symptoms worsen or if the condition fails to improve as anticipated.  [facility] [phone]  Other Instructions Increase fluids Take Ondansetron medicine as prescribed for nausea and vomiting. Start over the counter Imodium for diarrhea.  Start a [employer] with toast, applesauce, bananas, etc. If tolerated then advance to regular diet. Do not eat undercooked meats or raw fish until symptoms completely resolve.  Follow up with PCP or go to UC if symptoms continue or worsen.   If you have been instructed to have an in-person evaluation today at a local Urgent Care facility, please use the link below. It will take you to a list of all of our available [facility], including address, phone number and hours of operation. Please do not delay care.  [facility]  If you or a family member do not have a primary care provider, use the link below to schedule a visit and establish care. When you choose a [facility] primary care physician or advanced practice provider, you gain a long-term partner in health. Find a Primary Care Provider  Learn more about [facility]'s in-office  and virtual care options: [facility] - Get Care Now

## 2023-08-22 ENCOUNTER — Other Ambulatory Visit: Payer: Self-pay

## 2023-08-25 ENCOUNTER — Other Ambulatory Visit: Payer: Self-pay

## 2023-08-25 ENCOUNTER — Ambulatory Visit (INDEPENDENT_AMBULATORY_CARE_PROVIDER_SITE_OTHER): Payer: BC Managed Care – PPO | Admitting: Pharmacist

## 2023-08-25 ENCOUNTER — Other Ambulatory Visit (HOSPITAL_COMMUNITY): Payer: Self-pay

## 2023-08-25 DIAGNOSIS — B20 Human immunodeficiency virus [HIV] disease: Secondary | ICD-10-CM | POA: Diagnosis not present

## 2023-08-25 DIAGNOSIS — Z113 Encounter for screening for infections with a predominantly sexual mode of transmission: Secondary | ICD-10-CM | POA: Diagnosis not present

## 2023-08-25 MED ORDER — CABOTEGRAVIR & RILPIVIRINE ER 600 & 900 MG/3ML IM SUER
1.0000 | Freq: Once | INTRAMUSCULAR | Status: AC
  Administered 2023-08-25: 1 via INTRAMUSCULAR

## 2023-08-25 NOTE — Progress Notes (Signed)
HPI: Brenda Riley is a 33 y.o. female who presents to the [facility] pharmacy clinic for [place] administration.  Patient Active Problem List   Diagnosis Date Noted   Broken tooth 06/24/2022   Preventative health care 01/01/2022   Paresthesia of both hands 03/08/2021   History of suicide attempt 05/20/2016   Severe bipolar I disorder, current or most recent episode depressed (HCC) 05/20/2016   Anxiety 01/15/2016   Severe episode of recurrent major depressive disorder (HCC) 12/19/2015   HIV disease (HCC) 12/18/2015   Suicidal behavior 12/18/2015    Patient's Medications  New Prescriptions   No medications on file  Previous Medications   CABOTEGRAVIR & RILPIVIRINE ER (CABENUVA) 600 & 900 MG/3ML INJECTION    Inject 1 kit into the muscle every 2 (two) months.   CHLORHEXIDINE (PERIDEX) 0.12 % SOLUTION    RINSE AND GARGLE 15 ML BY MOUTH OR THROAT TWICE DAILY AS DIRECTED   FLUOXETINE (PROZAC) 10 MG TABLET    TAKE 1 TABLET BY MOUTH DAILY   NAPROXEN (NAPROSYN) 500 MG TABLET    Take 1 tablet (500 mg total) by mouth 2 (two) times daily with a meal.   ONDANSETRON (ZOFRAN) 4 MG TABLET    Take 1 tablet (4 mg total) by mouth every 8 (eight) hours as needed for nausea or vomiting.   ONDANSETRON (ZOFRAN-ODT) 8 MG DISINTEGRATING TABLET    Take 1 tablet (8 mg total) by mouth every 8 (eight) hours as needed for nausea or vomiting.   TRIAMCINOLONE CREAM (KENALOG) 0.1 %    Apply 1 Application topically 2 (two) times daily.  Modified Medications   No medications on file  Discontinued Medications   No medications on file    Allergies: No Known Allergies  Labs: Lab Results  Component Value Date   HIV1RNAQUANT Not Detected 02/27/2023   HIV1RNAQUANT <20 (H) 08/26/2022   HIV1RNAQUANT Not Detected 04/25/2022   CD4TABS 933 02/27/2023   CD4TABS 989 08/26/2022   CD4TABS 917 01/01/2022    RPR and STI Lab Results  Component Value Date   LABRPR NON-REACTIVE 02/27/2023   LABRPR NON-REACTIVE  10/29/2021   LABRPR NON-REACTIVE 08/29/2021   LABRPR NON-REACTIVE 03/08/2021   LABRPR NON REAC 05/03/2016    STI Results GC CT  01/01/2023  9:07 AM Negative  Negative   02/20/2022  9:01 AM Negative  Negative   09/05/2021  2:40 PM Negative  Negative   09/05/2021 10:25 AM Negative  Negative   05/20/2016 12:00 AM Negative  Negative     Hepatitis B Lab Results  Component Value Date   HEPBSAB POS (A) 01/15/2016   HEPBSAG NEGATIVE 01/15/2016   Hepatitis C No results found for: "HEPCAB", "HCVRNAPCRQN" Hepatitis A Lab Results  Component Value Date   HAV NON-REACTIVE 04/26/2021   Lipids: Lab Results  Component Value Date   CHOL 154 02/27/2023   TRIG 78 02/27/2023   HDL 45 (L) 02/27/2023   CHOLHDL 3.4 02/27/2023   LDLCALC 92 02/27/2023    TARGET DATE: 22nd  Assessment: Brenda Riley presents today for 1 month Cabenuva injection with her 33 year old son. Past injections were tolerated well without issues. Last seen by Judeth Cornfield 07/18/23 for Cabenuva injection restart (within window to maintain original target date 22nd). Had been taking Biktarvy to bridge to appointment but experienced significant nausea/vomiting. Reached out to [facility], was counseled to stop Biktarvy and was prescribed ondansetron to help with the nausea. Her nausea did resolve following the discontinuation of Biktarvy and she would not  like to try it again in the future. Last HIV RNA was undetectable in August 2024. Will order HIV RNA today.   Brenda Riley expresses she is feeling great today. She reports having a stomach bug last week and required additional ondansetron and imodium which did help her symptoms. She has since recovered and is feeling much better.  Administered cabotegravir 600mg /57mL in left upper outer quadrant of the gluteal muscle. Administered rilpivirine 900 mg/18mL in the right upper outer quadrant of the gluteal muscle. No issues with injections. Brenda Riley will follow up in 2 months for next set of  injections.  Brenda Riley has not had any new partners since her last visit or any concerns for STIs, however, consents to RPR test since she is drawing blood for viral load.  Eligible for HepA and COVID vaccinations. She politely declined and would like to defer HepA until her next visit in 2 months. When scheduling, prefers morning early morning appointments if possible.  Plan: - Cabenuva injections administered - HIV RNA, RPR - Next injections scheduled for 10/22/23 with Cassie - Call with any issues or questions  Stephenie Acres, PharmD PGY1 Pharmacy Resident 08/25/2023 9:08 AM

## 2023-08-26 ENCOUNTER — Telehealth: Payer: Self-pay

## 2023-08-26 NOTE — Telephone Encounter (Signed)
 [facility] Patient Advocate Encounter  Patient's medications Renaldo Harrison have been couriered to [facility] from [facility] and will be administered at the patients appointment on 08/25/23.  Clearance Coots, CPhT Specialty Pharmacy Patient [facility] Phone: [phone] Fax:  [phone]

## 2023-08-27 LAB — HIV-1 RNA QUANT-NO REFLEX-BLD
HIV 1 RNA Quant: NOT DETECTED {copies}/mL
HIV-1 RNA Quant, Log: NOT DETECTED {Log}

## 2023-08-27 LAB — RPR: RPR Ser Ql: NONREACTIVE

## 2023-10-08 ENCOUNTER — Other Ambulatory Visit (HOSPITAL_COMMUNITY): Payer: Self-pay

## 2023-10-08 ENCOUNTER — Other Ambulatory Visit: Payer: Self-pay

## 2023-10-08 NOTE — Progress Notes (Signed)
 Specialty Pharmacy Refill Coordination Note  Brenda Riley is a 33 y.o. female assessed today regarding refills of clinic administered specialty medication(s) Cabotegravir & Rilpivirine [facility])   Clinic requested Courier to Provider Office   Delivery date: 10/16/23   Verified address: [address] [place] [address]   Medication will be filled on 10/15/23.

## 2023-10-16 ENCOUNTER — Telehealth: Payer: Self-pay

## 2023-10-16 NOTE — Telephone Encounter (Signed)
 [facility] Patient Advocate Encounter  Patient's medications CABENUVA have been couriered to [facility] from [facility] pharmacy and will be administered at the patients appointment on 10/22/23.  Kae Heller, CPhT Specialty Pharmacy Patient [facility] Phone: [phone] Fax:  [phone]

## 2023-10-21 NOTE — Progress Notes (Unsigned)
 HPI: Brenda Riley is a 33 y.o. female who presents to the [facility] pharmacy clinic for [place] administration.  Patient Active Problem List   Diagnosis Date Noted   Broken tooth 06/24/2022   Preventative health care 01/01/2022   Paresthesia of both hands 03/08/2021   History of suicide attempt 05/20/2016   Severe bipolar I disorder, current or most recent episode depressed (HCC) 05/20/2016   Anxiety 01/15/2016   Severe episode of recurrent major depressive disorder (HCC) 12/19/2015   HIV disease (HCC) 12/18/2015   Suicidal behavior 12/18/2015    Patient's Medications  New Prescriptions   No medications on file  Previous Medications   CABOTEGRAVIR & RILPIVIRINE ER (CABENUVA) 600 & 900 MG/3ML INJECTION    Inject 1 kit into the muscle every 2 (two) months.   CHLORHEXIDINE (PERIDEX) 0.12 % SOLUTION    RINSE AND GARGLE 15 ML BY MOUTH OR THROAT TWICE DAILY AS DIRECTED   FLUOXETINE (PROZAC) 10 MG TABLET    TAKE 1 TABLET BY MOUTH DAILY   NAPROXEN (NAPROSYN) 500 MG TABLET    Take 1 tablet (500 mg total) by mouth 2 (two) times daily with a meal.   ONDANSETRON (ZOFRAN) 4 MG TABLET    Take 1 tablet (4 mg total) by mouth every 8 (eight) hours as needed for nausea or vomiting.   ONDANSETRON (ZOFRAN-ODT) 8 MG DISINTEGRATING TABLET    Take 1 tablet (8 mg total) by mouth every 8 (eight) hours as needed for nausea or vomiting.   TRIAMCINOLONE CREAM (KENALOG) 0.1 %    Apply 1 Application topically 2 (two) times daily.  Modified Medications   No medications on file  Discontinued Medications   No medications on file    Allergies: No Known Allergies  Labs: Lab Results  Component Value Date   HIV1RNAQUANT Not Detected 08/25/2023   HIV1RNAQUANT Not Detected 02/27/2023   HIV1RNAQUANT <20 (H) 08/26/2022   CD4TABS 933 02/27/2023   CD4TABS 989 08/26/2022   CD4TABS 917 01/01/2022    RPR and STI Lab Results  Component Value Date   LABRPR NON-REACTIVE 08/25/2023   LABRPR NON-REACTIVE  02/27/2023   LABRPR NON-REACTIVE 10/29/2021   LABRPR NON-REACTIVE 08/29/2021   LABRPR NON-REACTIVE 03/08/2021    STI Results GC CT  01/01/2023  9:07 AM Negative  Negative   02/20/2022  9:01 AM Negative  Negative   09/05/2021  2:40 PM Negative  Negative   09/05/2021 10:25 AM Negative  Negative   05/20/2016 12:00 AM Negative  Negative     Hepatitis B Lab Results  Component Value Date   HEPBSAB POS (A) 01/15/2016   HEPBSAG NEGATIVE 01/15/2016   Hepatitis C No results found for: "HEPCAB", "HCVRNAPCRQN" Hepatitis A Lab Results  Component Value Date   HAV NON-REACTIVE 04/26/2021   Lipids: Lab Results  Component Value Date   CHOL 154 02/27/2023   TRIG 78 02/27/2023   HDL 45 (L) 02/27/2023   CHOLHDL 3.4 02/27/2023   LDLCALC 92 02/27/2023    TARGET DATE: 22nd  Assessment: Brenda Riley presents today for 2 month maintenance Cabenuva injections. Recently was out of her target window in January, received a re-load of [place] but maintained her target date of the 22nd.  Past injections were tolerated well without issues. Last HIV RNA was undetectable in 08/2023. Doing well with no issues today.  Administered cabotegravir 600mg /60mL in left upper outer quadrant of the gluteal muscle. Administered rilpivirine 900 mg/47mL in the right upper outer quadrant of the gluteal muscle. No issues with  injections. Brenda Riley will follow up in 2 months for next set of injections.  Immunizations: eligible for the HAV and shingles vaccination  Plan: - Cabenuva injections administered - Next injections scheduled for *** - Call with any issues or questions

## 2023-10-22 ENCOUNTER — Other Ambulatory Visit: Payer: Self-pay

## 2023-10-22 ENCOUNTER — Ambulatory Visit (INDEPENDENT_AMBULATORY_CARE_PROVIDER_SITE_OTHER): Payer: BC Managed Care – PPO | Admitting: Pharmacist

## 2023-10-22 DIAGNOSIS — B20 Human immunodeficiency virus [HIV] disease: Secondary | ICD-10-CM | POA: Diagnosis not present

## 2023-10-22 DIAGNOSIS — Z23 Encounter for immunization: Secondary | ICD-10-CM

## 2023-10-22 MED ORDER — CABOTEGRAVIR & RILPIVIRINE ER 600 & 900 MG/3ML IM SUER
1.0000 | Freq: Once | INTRAMUSCULAR | Status: AC
  Administered 2023-10-22: 1 via INTRAMUSCULAR

## 2023-10-24 LAB — COMPREHENSIVE METABOLIC PANEL WITH GFR
AG Ratio: 1.4 (calc) (ref 1.0–2.5)
ALT: 11 U/L (ref 6–29)
AST: 15 U/L (ref 10–30)
Albumin: 4.2 g/dL (ref 3.6–5.1)
Alkaline phosphatase (APISO): 47 U/L (ref 31–125)
BUN: 11 mg/dL (ref 7–25)
CO2: 25 mmol/L (ref 20–32)
Calcium: 9.2 mg/dL (ref 8.6–10.2)
Chloride: 107 mmol/L (ref 98–110)
Creat: 0.87 mg/dL (ref 0.50–0.97)
Globulin: 2.9 g/dL (ref 1.9–3.7)
Glucose, Bld: 91 mg/dL (ref 65–99)
Potassium: 4 mmol/L (ref 3.5–5.3)
Sodium: 140 mmol/L (ref 135–146)
Total Bilirubin: 0.3 mg/dL (ref 0.2–1.2)
Total Protein: 7.1 g/dL (ref 6.1–8.1)
eGFR: 90 mL/min/{1.73_m2} (ref >=60)

## 2023-10-24 LAB — HIV-1 RNA QUANT-NO REFLEX-BLD
HIV 1 RNA Quant: NOT DETECTED {copies}/mL
HIV-1 RNA Quant, Log: NOT DETECTED {Log_copies}/mL

## 2023-12-02 ENCOUNTER — Other Ambulatory Visit (HOSPITAL_COMMUNITY): Payer: Self-pay

## 2023-12-04 ENCOUNTER — Ambulatory Visit: Payer: Self-pay | Admitting: Family

## 2023-12-04 ENCOUNTER — Ambulatory Visit (INDEPENDENT_AMBULATORY_CARE_PROVIDER_SITE_OTHER): Admitting: Family

## 2023-12-04 ENCOUNTER — Encounter: Payer: Self-pay | Admitting: General Practice

## 2023-12-04 VITALS — BP 135/80 | HR 60 | Temp 98.1°F | Resp 16 | Ht 61.0 in | Wt 177.4 lb

## 2023-12-04 DIAGNOSIS — Z7689 Persons encountering health services in other specified circumstances: Secondary | ICD-10-CM

## 2023-12-04 DIAGNOSIS — G5603 Carpal tunnel syndrome, bilateral upper limbs: Secondary | ICD-10-CM

## 2023-12-04 DIAGNOSIS — M549 Dorsalgia, unspecified: Secondary | ICD-10-CM

## 2023-12-04 DIAGNOSIS — Z124 Encounter for screening for malignant neoplasm of cervix: Secondary | ICD-10-CM

## 2023-12-04 DIAGNOSIS — Z139 Encounter for screening, unspecified: Secondary | ICD-10-CM

## 2023-12-04 DIAGNOSIS — R200 Anesthesia of skin: Secondary | ICD-10-CM

## 2023-12-04 DIAGNOSIS — Z131 Encounter for screening for diabetes mellitus: Secondary | ICD-10-CM | POA: Diagnosis not present

## 2023-12-04 LAB — POCT GLYCOSYLATED HEMOGLOBIN (HGB A1C): Hemoglobin A1C: 5.5 % (ref 4.0–5.6)

## 2023-12-04 MED ORDER — GABAPENTIN 300 MG PO CAPS
300.0000 mg | ORAL_CAPSULE | Freq: Every day | ORAL | 0 refills | Status: AC
Start: 2023-12-04 — End: 2024-03-03

## 2023-12-04 MED ORDER — MELOXICAM 7.5 MG PO TABS: 7.5000 mg | ORAL_TABLET | Freq: Every day | ORAL | 0 refills | Status: AC

## 2023-12-04 NOTE — Progress Notes (Signed)
 Chest and back pain and tingling hands and feet,  not being able to sleep

## 2023-12-04 NOTE — Progress Notes (Signed)
 Subjective:    Brenda Riley - 33 y.o. female MRN [id number]  Date of birth: [birth year]  HPI  Brenda Riley is to establish care.   Current issues and/or concerns: - Left back pain radiating to chest for 2 months. Denies recent trauma/injury and red flag symptoms. Taking over-the-counter medications with minimal relief. - Bilateral hands and bilateral feet numbness. Denies recent trauma/injury and red flag symptoms.  - Diabetes screening.  - Referral to Gynecology for women's health screenings/maintenance.  - States Ashwagandha helping with sleep. - No further issues/concerns for discussion today.  ROS per HPI    Health Maintenance: Health Maintenance Due  Topic Date Due   Hepatitis C Screening  Never done     Past Medical History: Patient Active Problem List   Diagnosis Date Noted   Broken tooth 06/24/2022   Preventative health care 01/01/2022   Paresthesia of both hands 03/08/2021   History of suicide attempt 05/20/2016   Severe bipolar I disorder, current or most recent episode depressed (HCC) 05/20/2016   Anxiety 01/15/2016   Severe episode of recurrent major depressive disorder (HCC) 12/19/2015   HIV disease (HCC) 12/18/2015   Suicidal behavior 12/18/2015      Social History   reports that she has never smoked. She has never used smokeless tobacco. She reports that she does not currently use alcohol. She reports current drug use. Drug: Marijuana.   Family History  family history is not on file.   Medications: reviewed and updated   Objective:   Physical Exam BP 135/80   Pulse 60   Temp 98.1 F (36.7 C) (Oral)   Resp 16   Ht 5\' 1"  (1.549 m)   Wt 177 lb 6.4 oz (80.5 kg)   LMP 11/17/2023 (Exact Date)   SpO2 98%   BMI 33.52 kg/m   Physical Exam HENT:     Head: Normocephalic and atraumatic.     Nose: Nose normal.     Mouth/Throat:     Mouth: Mucous membranes are moist.     Pharynx: Oropharynx is clear.  Eyes:     Extraocular  Movements: Extraocular movements intact.     Conjunctiva/sclera: Conjunctivae normal.     Pupils: Pupils are equal, round, and reactive to light.  Cardiovascular:     Rate and Rhythm: Normal rate and regular rhythm.     Pulses: Normal pulses.     Heart sounds: Normal heart sounds.  Pulmonary:     Effort: Pulmonary effort is normal.     Breath sounds: Normal breath sounds.  Musculoskeletal:        General: Normal range of motion.     Right shoulder: Normal.     Left shoulder: Normal.     Right upper arm: Normal.     Left upper arm: Normal.     Right elbow: Normal.     Left elbow: Normal.     Right forearm: Normal.     Left forearm: Normal.     Right wrist: Normal.     Left wrist: Normal.     Right hand: Normal.     Left hand: Normal.     Cervical back: Normal, normal range of motion and neck supple.     Thoracic back: Normal.     Lumbar back: Normal.     Right hip: Normal.     Left hip: Normal.     Right upper leg: Normal.     Left upper leg: Normal.     Right  knee: Normal.     Left knee: Normal.     Right lower leg: Normal.     Left lower leg: Normal.     Right ankle: Normal.     Left ankle: Normal.     Right foot: Normal.     Left foot: Normal.  Neurological:     General: No focal deficit present.     Mental Status: She is alert and oriented to person, place, and time.  Psychiatric:        Mood and Affect: Mood normal.        Behavior: Behavior normal.        Assessment & Plan:  1. Encounter to establish care (Primary) - Patient presents today to establish care. During the interim follow-up with primary provider as scheduled.  - Return for annual physical examination, labs, and health maintenance. Arrive fasting meaning having no food for at least 8 hours prior to appointment. You may have only water or black coffee. Please take scheduled medications as normal.  2. Back pain, unspecified back location, unspecified back pain laterality, unspecified chronicity -  Meloxicam as prescribed. Counseled on medication adherence/adverse effects.  - Patient declined referral to Orthopedics.  - Follow-up with primary provider in 4 weeks or sooner if needed. - meloxicam (MOBIC) 7.5 MG tablet; Take 1 tablet (7.5 mg total) by mouth daily.  Dispense: 90 tablet; Refill: 0  3. Bilateral carpal tunnel syndrome - Gabapentin as prescribed. Counseled on medication adherence/adverse effects.  - Follow-up with primary provider in 4 weeks or sooner if needed. - gabapentin (NEURONTIN) 300 MG capsule; Take 1 capsule (300 mg total) by mouth at bedtime.  Dispense: 90 capsule; Refill: 0  4. Numbness in feet - Gabapentin as prescribed. Counseled on medication adherence/adverse effects.  - Follow-up with primary provider in 4 weeks or sooner if needed. - gabapentin (NEURONTIN) 300 MG capsule; Take 1 capsule (300 mg total) by mouth at bedtime.  Dispense: 90 capsule; Refill: 0  5. Diabetes mellitus screening - Routine screening.  - POCT glycosylated hemoglobin (Hb A1C)  6. Cervical cancer screening - Referral to Gynecology for evaluation/management. - Ambulatory referral to Gynecology  7. Encounter for screening involving social determinants of health (SDoH) - Referral to [facility] Care Management for community resources. - AMB Referral [facility] Care Management    Patient was given clear instructions to go to Emergency Department or return to medical center if symptoms don't improve, worsen, or new problems develop.The patient verbalized understanding.  I discussed the assessment and treatment plan with the patient. The patient was provided an opportunity to ask questions and all were answered. The patient agreed with the plan and demonstrated an understanding of the instructions.   The patient was advised to call back or seek an in-person evaluation if the symptoms worsen or if the condition fails to improve as anticipated.    Lavona Pounds, NP 12/04/2023, 12:34 PM Primary Care  at [facility]

## 2023-12-05 ENCOUNTER — Other Ambulatory Visit: Payer: Self-pay

## 2023-12-09 ENCOUNTER — Telehealth: Payer: Self-pay

## 2023-12-09 NOTE — Progress Notes (Signed)
   Telephone encounter was:  Successful.  Complex Care Management Note Care Guide Note  12/09/2023 Name: [name] MRN: [id number] DOB: 1991/05/23  Brenda Riley is a 32 y.o. year old female who is a primary care patient of Senaida Dama, NP . The community resource team was consulted for assistance with financial strain  SDOH screenings and interventions completed:  No        Care guide performed the following interventions: Patient provided with information about care guide support team and interviewed to confirm resource needs.pt requested I call back   Follow Up Plan:  Care guide will follow up with patient by phone over the next day  Encounter Outcome:  Patient Request to Call Back    Azell Leopard [facility]  [facility] Guide, Phone: [phone] Fax: [phone] Website: [email or web address]

## 2023-12-11 ENCOUNTER — Telehealth: Payer: Self-pay

## 2023-12-11 NOTE — Progress Notes (Signed)
   Telephone encounter was:  Successful.  Complex Care Management Note Care Guide Note  12/11/2023 Name: Brenda Riley MRN: [id number] DOB: 05/24/1991  Brenda Riley is a 33 y.o. year old female who is a primary care patient of Senaida Dama, NP . The community resource team was consulted for assistance with Food Insecurity and Financial Difficulties related to Financial strain  SDOH screenings and interventions completed:  Yes  Social Drivers of Health From This Encounter   Housing: High Risk (12/11/2023)   Housing Stability Vital Sign    Unable to Pay for Housing in the Last Year: Yes    Number of Times Moved in the Last Year: 0    Homeless in the Last Year: No  Financial Resource Strain: High Risk (12/11/2023)   Overall Financial Resource Strain (CARDIA)    Difficulty of Paying Living Expenses: Very hard  Utilities: At Risk (12/11/2023)   Utilities    Threatened with loss of utilities: Yes    SDOH Interventions Today    Flowsheet Row Most Recent Value  SDOH Interventions   Housing Interventions Community Resources Provided, [id number] Referral  Utilities Interventions Community Resources Provided, [id number] Referral  Financial Strain Interventions Community Resources Provided, [facility] Referral        Care guide performed the following interventions: Patient provided with information about care guide support team and interviewed to confirm resource needs.Patient is having financial strain and is needing assistance with food, utilities, housing. I will be mailing resources and adding referral to [place] [facility], I gave online resources as well for assistance.   Follow Up Plan:  No further follow up planned at this time. The patient has been provided with needed resources.  Encounter Outcome:  Patient Visit Completed    Azell Leopard [facility]  [facility] Guide, Phone: [phone] Fax: [phone] Website: [email or web address]

## 2023-12-15 ENCOUNTER — Telehealth: Payer: Self-pay | Admitting: *Deleted

## 2023-12-15 ENCOUNTER — Other Ambulatory Visit (HOSPITAL_COMMUNITY): Payer: Self-pay

## 2023-12-15 NOTE — Progress Notes (Unsigned)
 Complex Care Management Note  Care Guide Note 12/15/2023 Name: Brenda Riley MRN: [id number] DOB: [birth year]  Brenda Riley is a 33 y.o. year old female who sees Senaida Dama, NP for primary care. I reached out to Aleda Ammon by phone today to offer complex care management services.  Ms. Kapler was given information about Complex Care Management services today including:   The Complex Care Management services include support from the care team which includes your Nurse Care Manager, Clinical Social Worker, or Pharmacist.  The Complex Care Management team is here to help remove barriers to the health concerns and goals most important to you. Complex Care Management services are voluntary, and the patient may decline or stop services at any time by request to their care team member.   Complex Care Management Consent Status: Patient agreed to services and verbal consent obtained.   Follow up plan:  Telephone appointment with complex care management team member scheduled for:  12/29/23  Encounter Outcome:  Patient Scheduled  Barnie Bora  [facility]  [facility], [facility] Guide  Direct Dial: [phone]  Fax [phone]

## 2023-12-16 ENCOUNTER — Other Ambulatory Visit (HOSPITAL_COMMUNITY): Payer: Self-pay

## 2023-12-16 ENCOUNTER — Other Ambulatory Visit: Payer: Self-pay

## 2023-12-16 NOTE — Progress Notes (Signed)
 Specialty Pharmacy Refill Coordination Note  Brenda Riley is a 33 y.o. female assessed today regarding refills of clinic administered specialty medication(s) Cabotegravir  & Rilpivirine  (CABENUVA )   Clinic requested Courier to Provider Office   Delivery date: 12/25/23   Verified address: [address] [place] [address]   Medication will be filled on 12/24/23.

## 2023-12-25 ENCOUNTER — Telehealth: Payer: Self-pay

## 2023-12-25 NOTE — Telephone Encounter (Signed)
 [facility] Patient Advocate Encounter  Patient's medications CABENUVA  have been couriered to [facility] from [facility] and will be administered at the patients appointment on 01/01/24.  Verline Glow, CPhT Specialty Pharmacy Patient [facility] Phone: [phone] Fax:  [phone]

## 2023-12-29 ENCOUNTER — Other Ambulatory Visit: Payer: Self-pay

## 2023-12-29 NOTE — Patient Outreach (Signed)
 Attempted to call patient for initial CCM visit. Patient answers at scheduled time but states she is at work and unable to complete visit. Attempted to call patient back during her lunch break but no answer. HIPAA compliant voicemail left. Will continue with attempts to reach patient for assessment.  Rosaline Finlay, RN MSN [place]  [facility] Population Health RN Care Manager Direct Dial: [phone]  Fax: [phone]

## 2024-01-01 ENCOUNTER — Ambulatory Visit: Admitting: Infectious Diseases

## 2024-01-01 ENCOUNTER — Other Ambulatory Visit (HOSPITAL_COMMUNITY)
Admission: RE | Admit: 2024-01-01 | Discharge: 2024-01-01 | Disposition: A | Discharge status: Home / Self Care | Source: Ambulatory Visit | Attending: Infectious Diseases | Admitting: Infectious Diseases

## 2024-01-01 ENCOUNTER — Other Ambulatory Visit: Payer: Self-pay

## 2024-01-01 ENCOUNTER — Encounter: Payer: Self-pay | Admitting: Infectious Diseases

## 2024-01-01 VITALS — BP 106/73 | HR 64 | Temp 97.7°F | Ht 61.0 in | Wt 175.0 lb

## 2024-01-01 DIAGNOSIS — Z113 Encounter for screening for infections with a predominantly sexual mode of transmission: Secondary | ICD-10-CM | POA: Diagnosis not present

## 2024-01-01 DIAGNOSIS — B20 Human immunodeficiency virus [HIV] disease: Secondary | ICD-10-CM | POA: Diagnosis not present

## 2024-01-01 DIAGNOSIS — F419 Anxiety disorder, unspecified: Secondary | ICD-10-CM | POA: Diagnosis not present

## 2024-01-01 DIAGNOSIS — F314 Bipolar disorder, current episode depressed, severe, without psychotic features: Secondary | ICD-10-CM

## 2024-01-01 MED ORDER — FLUOXETINE HCL 10 MG PO TABS: 10.0000 mg | ORAL_TABLET | Freq: Every day | ORAL | 5 refills | Status: DC

## 2024-01-01 MED ORDER — CABOTEGRAVIR & RILPIVIRINE ER 600 & 900 MG/3ML IM SUER
1.0000 | Freq: Once | INTRAMUSCULAR | Status: AC
  Administered 2024-01-01: 1 via INTRAMUSCULAR

## 2024-01-01 MED ORDER — HYDROXYZINE HCL 25 MG PO TABS: 25.0000 mg | ORAL_TABLET | Freq: Three times a day (TID) | ORAL | 1 refills | Status: AC | PRN

## 2024-01-01 NOTE — Addendum Note (Signed)
 Addended by: CELESTIA LELA HERO on: 01/01/2024 09:56 AM   Modules accepted: Orders

## 2024-01-01 NOTE — Patient Instructions (Signed)
 START Prozac  - one 10 mg tablet once a day. This is a once daily treatment for anxiety we talked about.   I will also send in Hydroxyzine  - you can use this up to 3 times a day (every 6-8 hours) for anxiety moments you need some help calming down from.   Breathing exercises (box breathing) are helpful to calm the nervous system  If you need an increase to 20 mg prozac  when you see pharmacy again for your next shot we can increase then.

## 2024-01-01 NOTE — Progress Notes (Signed)
 Name: Brenda Riley  DOB: [birth year] MRN: [id number] PCP: Lorren Greig PARAS, NP    Brief Narrative:  Brenda Riley is a 33 y.o. female with HIV, Dx 09-2012 CD4 nadir > 200 reported HIV Risk: heterosexual contact History of OIs: none Intake Labs: Hep B sAg (-), sAb (+) 2017, Hep A (), Hep C () Quantiferon () HLA B*5701 () G6PD: ()   Previous Regimens: 2014 - kaletra + combivir  Stribild Tivicay  + Truvada 2017 Cabenuva  04-2021  Genotypes: None on file currently.   Subjective   Subjective:   Chief Complaint  Patient presents with   Follow-up    Discussed the use of AI scribe software for clinical note transcription with the patient, who gave verbal consent to proceed.  History of Present Illness   Brenda Riley is a 33 year old female with HIV who presents for follow-up care and Cabenuva  administration.  Brenda Riley reports improvement in her condition and has established care with a primary care physician whom Brenda Riley likes.  Brenda Riley experiences anxiety and has been smoking. Brenda Riley recalls that Prozac  was previously helpful for her anxiety but is currently unable to obtain it. Brenda Riley is interested in resuming Prozac  and using hydroxyzine  as needed for heightened anxiety.  Brenda Riley faces financial difficulties, with a reduction in food stamps to twenty-three dollars, causing worry. Brenda Riley manages her situation by balancing bills and food expenses.  Her medication regimen includes vitamins such as cortisol and ashwagandha. Brenda Riley is open to checking her measles titers to determine if Brenda Riley needs a booster.  Brenda Riley is interested in reducing the frequency of her Cabenuva  injections but understands the current treatment protocol. Brenda Riley wants to minimize daily interruptions from her medication regimen.  Brenda Riley is considering receiving the shingles vaccine, as her oldest son's father had shingles, and Brenda Riley is aware of the associated pain.        12/04/2023    9:43 AM 07/18/2023   10:08 AM  01/01/2023    8:39 AM  Depression screen PHQ 2/9  Decreased Interest 1 2 0  Down, Depressed, Hopeless 2 2 1   PHQ - 2 Score 3 4 1   Altered sleeping 1 3   Tired, decreased energy 2 3   Change in appetite 2 3   Feeling bad or failure about yourself  3 2   Trouble concentrating 3 3   Moving slowly or fidgety/restless 1 3   Suicidal thoughts 0 0   PHQ-9 Score 15 21   Difficult doing work/chores Very difficult Somewhat difficult       12/04/2023    9:44 AM  GAD 7 : Generalized Anxiety Score  Nervous, Anxious, on Edge 2  Control/stop worrying 3  Worry too much - different things 3  Trouble relaxing 1  Restless 1  Easily annoyed or irritable 2  Afraid - awful might happen 3  Total GAD 7 Score 15  Anxiety Difficulty Very difficult      ROS   Past Medical History:  Diagnosis Date   Alcohol abuse 05/20/2016   History of suicide attempt 05/20/2016   HIV antibody positive (HCC)    HIV disease (HCC) 01/15/2016   Multiple personality disorder (HCC) 05/20/2016    Outpatient Medications Prior to Visit  Medication Sig Dispense Refill   gabapentin  (NEURONTIN ) 300 MG capsule Take 1 capsule (300 mg total) by mouth at bedtime. 90 capsule 0   meloxicam  (MOBIC ) 7.5 MG tablet Take 1 tablet (7.5 mg total) by mouth daily. 90 tablet  0   naproxen  (NAPROSYN ) 500 MG tablet Take 1 tablet (500 mg total) by mouth 2 (two) times daily with a meal. 30 tablet 0   ondansetron  (ZOFRAN ) 4 MG tablet Take 1 tablet (4 mg total) by mouth every 8 (eight) hours as needed for nausea or vomiting. 20 tablet 0   ondansetron  (ZOFRAN -ODT) 8 MG disintegrating tablet Take 1 tablet (8 mg total) by mouth every 8 (eight) hours as needed for nausea or vomiting. 10 tablet 0   triamcinolone  cream (KENALOG ) 0.1 % Apply 1 Application topically 2 (two) times daily. 60 g 0   cabotegravir  & rilpivirine  ER (CABENUVA ) 600 & 900 MG/3ML injection Inject 1 kit into the muscle every 2 (two) months. 6 mL 5   chlorhexidine  (PERIDEX ) 0.12  % solution RINSE AND GARGLE 15 ML BY MOUTH OR THROAT TWICE DAILY AS DIRECTED (Patient not taking: Reported on 07/18/2023) 1893 mL 0   FLUoxetine  (PROZAC ) 10 MG tablet TAKE 1 TABLET BY MOUTH DAILY (Patient not taking: Reported on 07/18/2023) 30 tablet 5   No facility-administered medications prior to visit.     No Known Allergies  Social History   Tobacco Use   Smoking status: Never   Smokeless tobacco: Never  Vaping Use   Vaping status: Some Days  Substance Use Topics   Alcohol use: Not Currently    Comment: socially   Drug use: Yes    Types: Marijuana    No family history on file.  Social History   Substance and Sexual Activity  Sexual Activity Not Currently   Partners: Male     Objective    Objective:   Vitals:   01/01/24 0852  BP: 106/73  Pulse: 64  Temp: 97.7 F (36.5 C)  TempSrc: Temporal  SpO2: 100%  Weight: 175 lb (79.4 kg)  Height: 5' 1 (1.549 m)    Body mass index is 33.07 kg/m.  Physical Exam Constitutional:      Appearance: Normal appearance. Brenda Riley is not ill-appearing.  HENT:     Mouth/Throat:     Mouth: Mucous membranes are moist.     Pharynx: Oropharynx is clear.   Eyes:     General: No scleral icterus.   Cardiovascular:     Rate and Rhythm: Normal rate and regular rhythm.  Pulmonary:     Effort: Pulmonary effort is normal.   Neurological:     Mental Status: Brenda Riley is oriented to person, place, and time.   Psychiatric:        Mood and Affect: Mood normal.        Thought Content: Thought content normal.     Lab Results Lab Results  Component Value Date   WBC 4.1 02/27/2023   HGB 12.8 02/27/2023   HCT 39.5 02/27/2023   MCV 88.0 02/27/2023   PLT 252 02/27/2023    Lab Results  Component Value Date   CREATININE 0.87 10/22/2023   BUN 11 10/22/2023   NA 140 10/22/2023   K 4.0 10/22/2023   CL 107 10/22/2023   CO2 25 10/22/2023    Lab Results  Component Value Date   ALT 11 10/22/2023   AST 15 10/22/2023   ALKPHOS 57  09/05/2021   BILITOT 0.3 10/22/2023    Lab Results  Component Value Date   CHOL 154 02/27/2023   HDL 45 (L) 02/27/2023   LDLCALC 92 02/27/2023   TRIG 78 02/27/2023   CHOLHDL 3.4 02/27/2023   HIV 1 RNA Quant  Date Value  10/22/2023 NOT DETECTED  copies/mL  08/25/2023 Not Detected Copies/mL  02/27/2023 Not Detected Copies/mL   CD4 T Cell Abs (/uL)  Date Value  02/27/2023 933  08/26/2022 989  01/01/2022 917     Assessment & Plan:     HIV infection - HIV infection is well-managed with Cabenuva  injections every two months, which Brenda Riley prefers over daily oral medication. Current regimen is optimal for maintaining viral suppression. - Continue Cabenuva  injections every two months. - Order routine blood work to monitor HIV status. - Perform urine screening.  Anxiety - Anxiety is not well-controlled. Previously, Prozac  was effective, but Brenda Riley is not currently taking it due to prescription issues. Hydroxyzine  is discussed as an as-needed option for acute anxiety episodes. The combination of Prozac  and hydroxyzine  is considered safe and effective for her needs. - Prescribe Prozac  10 mg daily and reassess in two months. Can increase to 20 mg if needed.  - Prescribe hydroxyzine  as needed for acute anxiety, up to three times a day.   Health Maintenance -  Discussed recent measles case in [place] . Plan to check measles titers to determine if a booster is needed. Discussed shingles vaccination. Brenda Riley qualifies for it but can defer it as her immune system is well-regulated. Shingles is painful, and vaccination is typically recommended for those over fifty or with compromised immune systems. - Consider shingles vaccination in the future - Check measles titers to assess immunity.     Meds ordered this encounter  Medications   FLUoxetine  (PROZAC ) 10 MG tablet    Sig: Take 1 tablet (10 mg total) by mouth daily.    Dispense:  30 tablet    Refill:  5    Please deliver to patient    hydrOXYzine  (ATARAX ) 25 MG tablet    Sig: Take 1 tablet (25 mg total) by mouth 3 (three) times daily as needed.    Dispense:  60 tablet    Refill:  1    Please mail to patient   Orders Placed This Encounter  Procedures   COMPLETE METABOLIC PANEL WITHOUT GFR   CBC   HIV 1 RNA quant-no reflex-bld   T-helper cells (CD4) count   Lipid panel   RPR   Measles/Mumps/Rubella Immunity   02/20/2024 - next cabenuva  appointment   I will message her in 1 month to check in on prozac  effect.    Corean Fireman, MSN, NP-C [facility] for Infectious Disease [facility] Pager: [phone] Office: [phone]  01/01/24  9:18 AM

## 2024-01-02 LAB — URINE CYTOLOGY ANCILLARY ONLY
Chlamydia: NEGATIVE
Comment: NEGATIVE
Comment: NEGATIVE
Comment: NORMAL
Neisseria Gonorrhea: NEGATIVE
Trichomonas: POSITIVE — AB

## 2024-01-02 LAB — T-HELPER CELLS (CD4) COUNT (NOT AT ARMC)
CD4 % Helper T Cell: 44 % (ref 33–65)
CD4 T Cell Abs: 1117 /uL (ref 400–1790)

## 2024-01-03 LAB — COMPLETE METABOLIC PANEL WITHOUT GFR
AG Ratio: 1.4 (calc) (ref 1.0–2.5)
ALT: 12 U/L (ref 6–29)
AST: 18 U/L (ref 10–30)
Albumin: 4.2 g/dL (ref 3.6–5.1)
Alkaline phosphatase (APISO): 49 U/L (ref 31–125)
BUN: 12 mg/dL (ref 7–25)
CO2: 24 mmol/L (ref 20–32)
Calcium: 9.3 mg/dL (ref 8.6–10.2)
Chloride: 107 mmol/L (ref 98–110)
Creat: 0.94 mg/dL (ref 0.50–0.97)
Globulin: 3 g/dL (ref 1.9–3.7)
Glucose, Bld: 86 mg/dL (ref 65–99)
Potassium: 4.3 mmol/L (ref 3.5–5.3)
Sodium: 138 mmol/L (ref 135–146)
Total Bilirubin: 0.3 mg/dL (ref 0.2–1.2)
Total Protein: 7.2 g/dL (ref 6.1–8.1)

## 2024-01-03 LAB — CBC
HCT: 38.7 % (ref 35.0–45.0)
Hemoglobin: 12.3 g/dL (ref 11.7–15.5)
MCH: 28.9 pg (ref 27.0–33.0)
MCHC: 31.8 g/dL — ABNORMAL LOW (ref 32.0–36.0)
MCV: 91.1 fL (ref 80.0–100.0)
MPV: 11.7 fL (ref 7.5–12.5)
Platelets: 235 10*3/uL (ref 140–400)
RBC: 4.25 10*6/uL (ref 3.80–5.10)
RDW: 12.5 % (ref 11.0–15.0)
WBC: 5.4 10*3/uL (ref 3.8–10.8)

## 2024-01-03 LAB — LIPID PANEL
Cholesterol: 151 mg/dL (ref <200)
HDL: 49 mg/dL — ABNORMAL LOW (ref >=50)
LDL Cholesterol (Calc): 88 mg/dL
Non-HDL Cholesterol (Calc): 102 mg/dL (ref <130)
Total CHOL/HDL Ratio: 3.1 (calc) (ref <5.0)
Triglycerides: 56 mg/dL (ref <150)

## 2024-01-03 LAB — MEASLES/MUMPS/RUBELLA IMMUNITY
Mumps IgG: 12.1 [AU]/ml
Rubella: 4.8 {index}
Rubeola IgG: >300 [AU]/ml

## 2024-01-03 LAB — HIV-1 RNA QUANT-NO REFLEX-BLD
HIV 1 RNA Quant: <20 {copies}/mL — AB
HIV-1 RNA Quant, Log: <1.3 {Log_copies}/mL — AB

## 2024-01-03 LAB — RPR: RPR Ser Ql: NONREACTIVE

## 2024-01-05 ENCOUNTER — Telehealth: Payer: Self-pay | Admitting: Family

## 2024-01-05 ENCOUNTER — Ambulatory Visit: Payer: Self-pay | Admitting: Infectious Diseases

## 2024-01-05 ENCOUNTER — Encounter: Admitting: Family

## 2024-01-05 MED ORDER — METRONIDAZOLE 500 MG PO TABS
500.0000 mg | ORAL_TABLET | Freq: Two times a day (BID) | ORAL | 0 refills | Status: AC
Start: 2024-01-05 — End: ?

## 2024-01-05 NOTE — Patient Outreach (Signed)
 Attempt #2 to reach patient for initial CCM visit. No answer. HIPAA compliant voicemail left. Will continue with outreach attempts.  Rosaline Finlay, RN MSN [place]  VBCI Population Health RN Care Manager Direct Dial: [phone]  Fax: [phone]

## 2024-01-05 NOTE — Progress Notes (Signed)
 Erroneous encounter-disregard

## 2024-01-05 NOTE — Telephone Encounter (Signed)
 Called pt and left vm to call office back to reschedule missed physical appt

## 2024-01-06 NOTE — Patient Outreach (Signed)
 Care Coordination   01/06/2024 Name: Brenda Riley MRN: [id number] DOB: Mar 20, 1991   Care Coordination Outreach Attempts:  A third unsuccessful outreach attempt was made today to complete initial CCM visit.  Follow Up Plan:  No further outreach attempts will be made at this time. We have been unable to contact the patient to enroll patient in complex care management services.  Encounter Outcome:  No Answer. HIPAA compliant voicemail left.   Rosaline Finlay, RN MSN [place]  VBCI Population Health RN Care Manager Direct Dial: [phone]  Fax: [phone]

## 2024-01-29 IMAGING — US US PELVIS COMPLETE TRANSABD/TRANSVAG W DUPLEX AND/OR DOPPLER
1 series · 13 of 25 positions shown · non-contrast
Comparison: None

CLINICAL DATA: Vaginal bleeding, LMP 08/25/2020, [id number]

EXAM:
TRANSABDOMINAL AND TRANSVAGINAL ULTRASOUND OF PELVIS
DOPPLER ULTRASOUND OF OVARIES
TECHNIQUE: Both transabdominal and transvaginal ultrasound examinations of the
pelvis were performed. Transabdominal technique was performed for
global imaging of the pelvis including uterus, ovaries, adnexal
regions, and pelvic cul-de-sac.
It was necessary to proceed with endovaginal exam following the
transabdominal exam to visualize the uterus, endometrium, and
ovaries. Color and duplex Doppler ultrasound was utilized to
evaluate blood flow to the ovaries.

[Series 1: us pelvic complete w transvaginal and torsion righ · 13 of 90 slices shown]
[im 1/90]
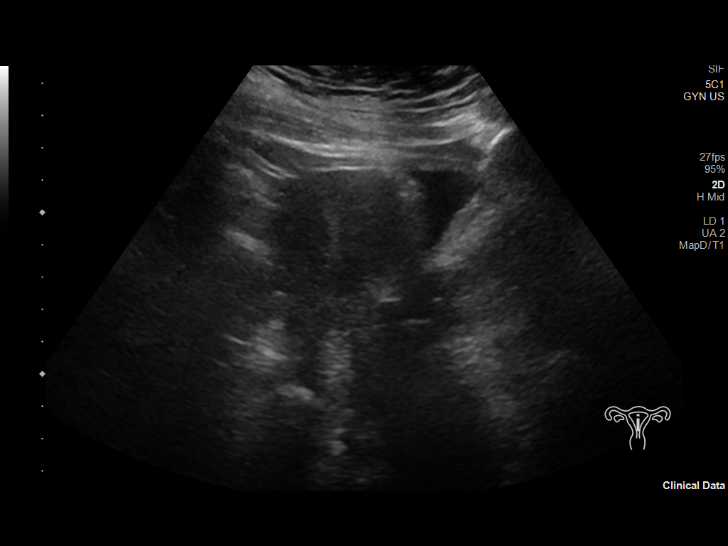
[im 8/90]
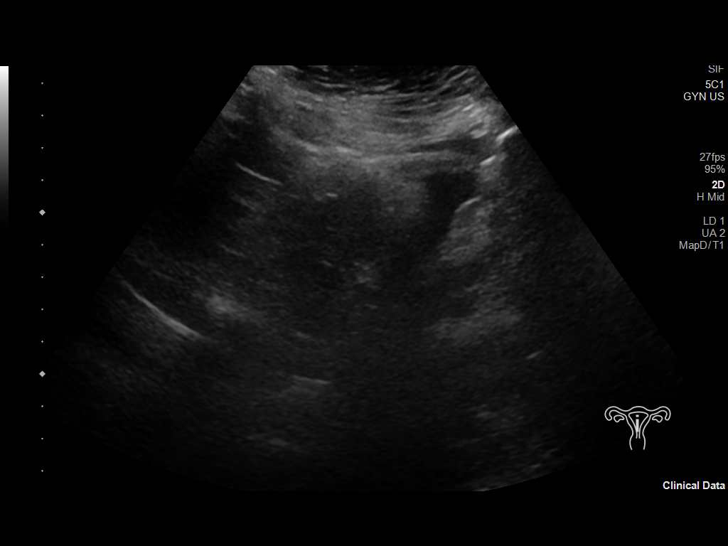
[im 15/90]
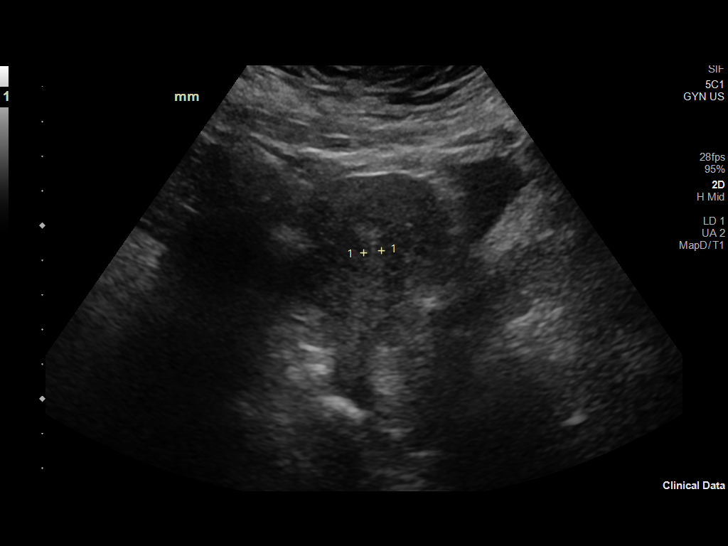
[im 23/90]
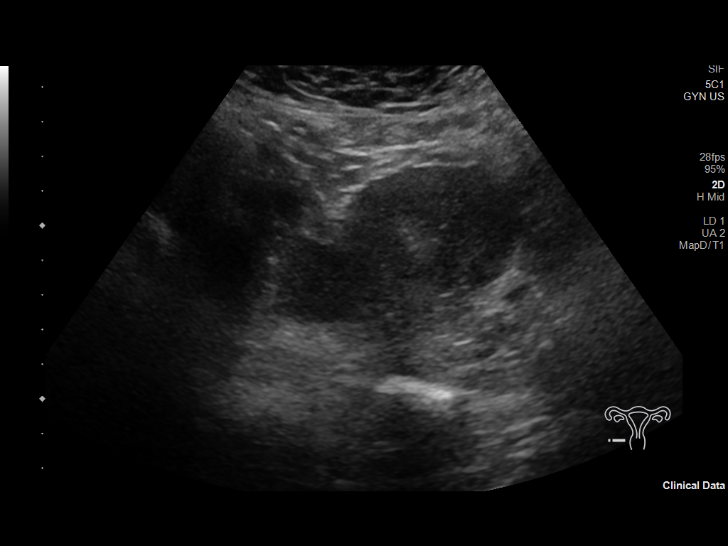
[im 30/90]
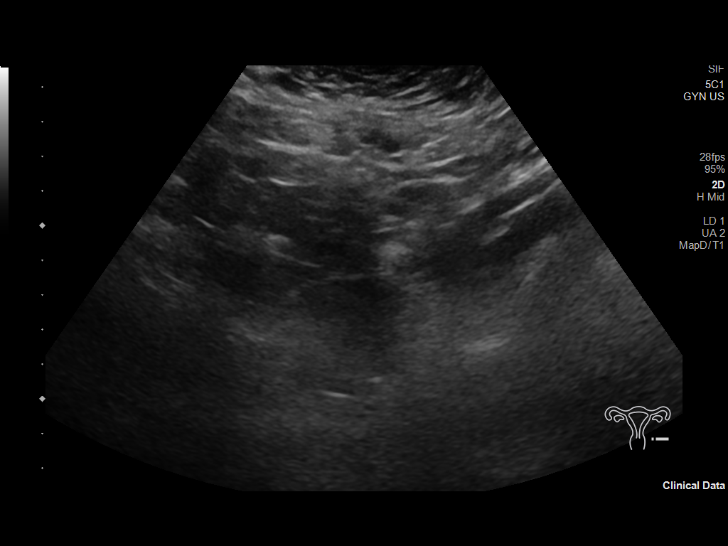
[im 38/90]
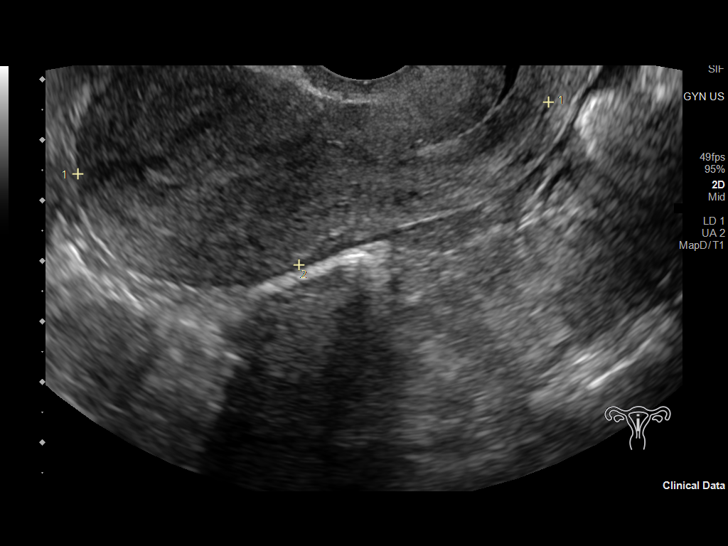
[im 45/90]
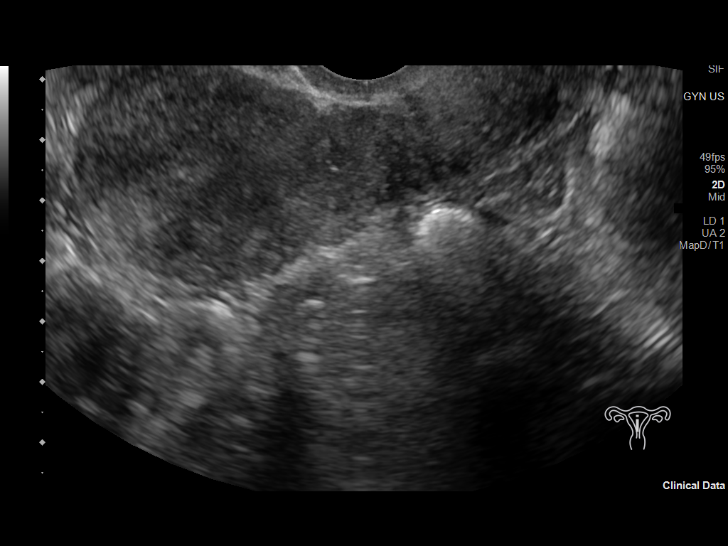
[im 52/90]
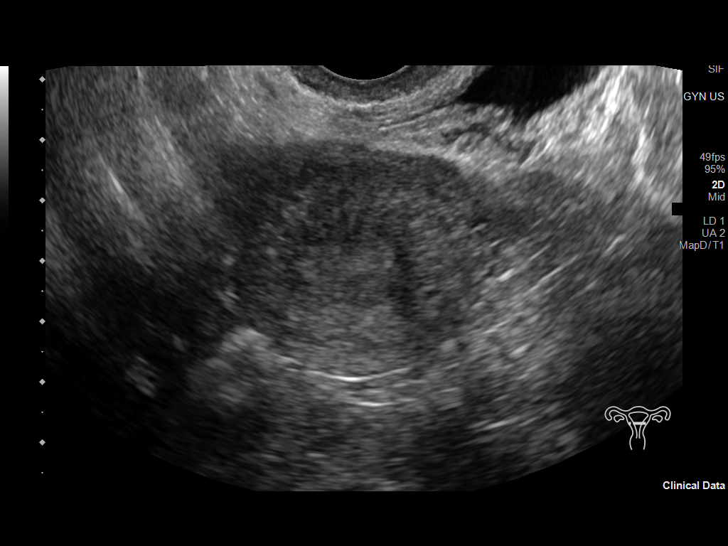
[im 60/90]
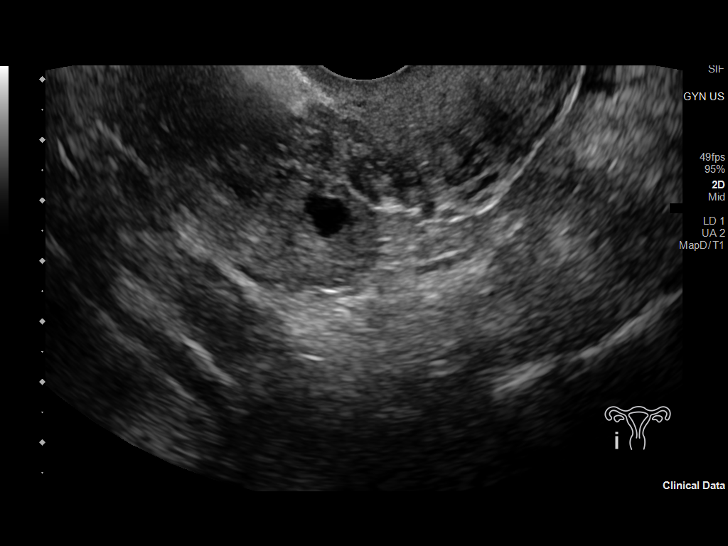
[im 67/90]
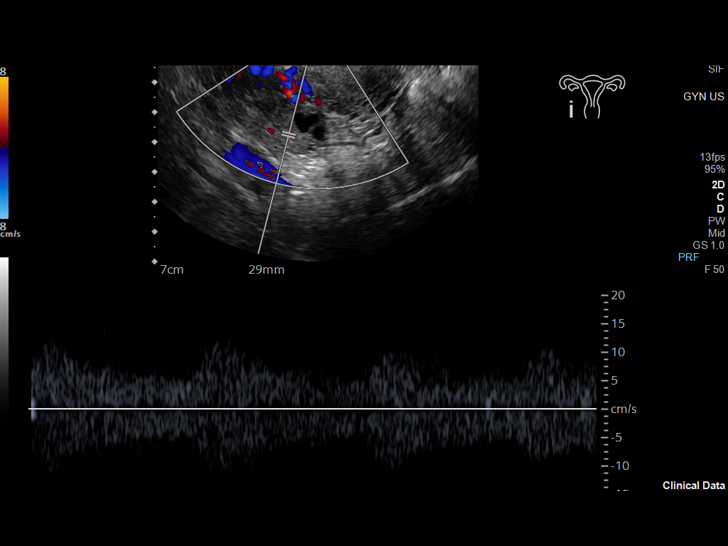
[im 75/90]
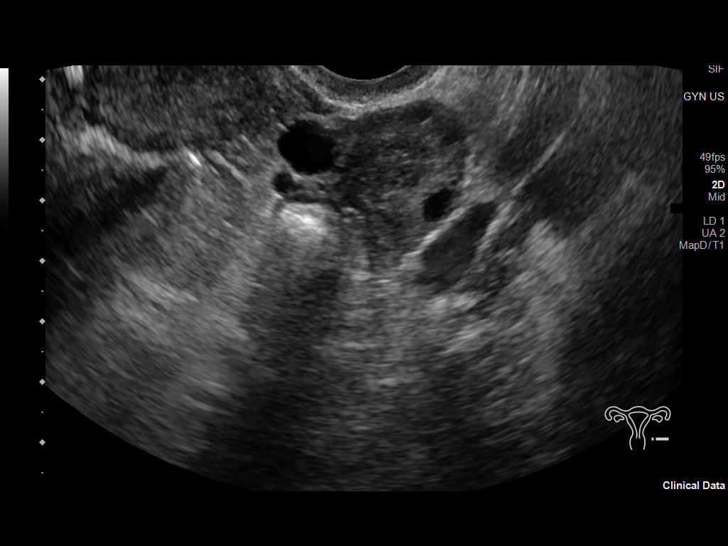
[im 82/90]
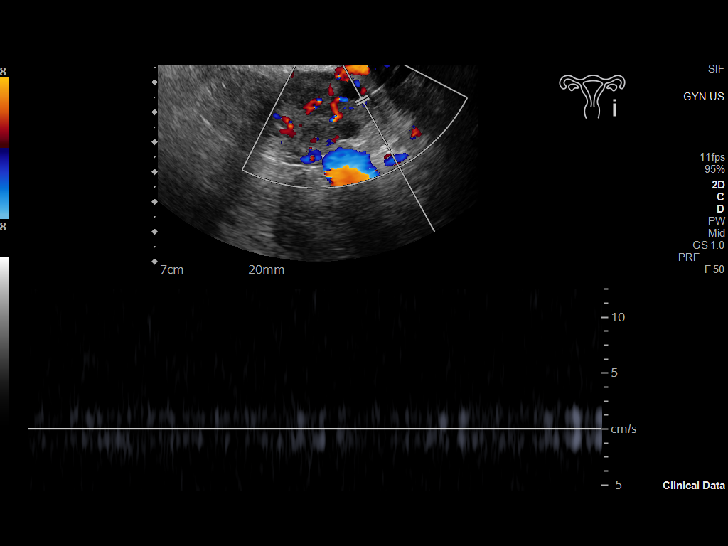
[im 90/90]
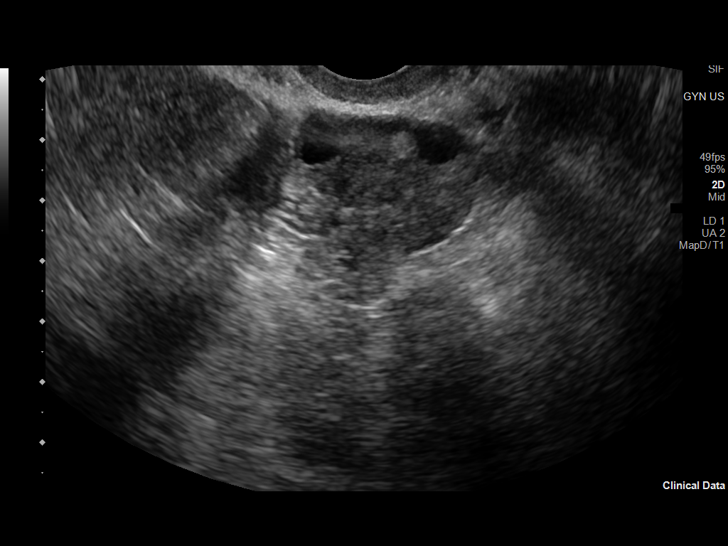

[13 of 25 positions shown; findings below may reference images not displayed]

FINDINGS: Uterus

Measurements: 7.9 x 3.7 x 4.0 cm = volume: 61 mL. Anteverted. Normal
morphology without mass

Endometrium

Thickness: 7 mm. No endometrial fluid or mass. Small amount of
nonspecific fluid within endocervical canal.

Right ovary

Measurements: 2.9 x 1.7 x 2.0 cm = volume: 5.1 mL. Normal morphology
without mass. Internal blood flow present on color Doppler imaging.

Left ovary

Measurements: 3.0 x 1.9 x 2.1 cm = volume: 6.1 mL. Normal morphology
without mass

Pulsed Doppler evaluation of both ovaries demonstrates low
resistance arterial waveforms in both ovaries. A venous waveform was
seen within LEFT ovary but not obtained in the RIGHT ovary.

Other findings

Trace free pelvic fluid.  No adnexal masses.
IMPRESSION: Small amount of nonspecific fluid within endocervical canal.

Otherwise negative exam.

## 2024-02-09 ENCOUNTER — Other Ambulatory Visit (HOSPITAL_COMMUNITY): Payer: Self-pay

## 2024-02-09 ENCOUNTER — Other Ambulatory Visit: Payer: Self-pay

## 2024-02-09 NOTE — Progress Notes (Signed)
 Specialty Pharmacy Refill Coordination Note  KERY HALTIWANGER is a 33 y.o. female assessed today regarding refills of clinic administered specialty medication(s) Cabotegravir  & Rilpivirine  (CABENUVA )   Clinic requested Courier to Provider Office   Delivery date: 02/16/24   Verified address: [address] [place] [address]   Medication will be filled on 02/13/24.

## 2024-02-13 ENCOUNTER — Other Ambulatory Visit: Payer: Self-pay

## 2024-02-16 ENCOUNTER — Telehealth: Payer: Self-pay

## 2024-02-16 NOTE — Progress Notes (Signed)
 HPI: Brenda Riley is a 33 y.o. female who presents to the [facility] pharmacy clinic for Cabenuva  administration.  Patient Active Problem List   Diagnosis Date Noted   Broken tooth 06/24/2022   Preventative health care 01/01/2022   Paresthesia of both hands 03/08/2021   History of suicide attempt 05/20/2016   Severe bipolar I disorder, current or most recent episode depressed (HCC) 05/20/2016   Anxiety 01/15/2016   Severe episode of recurrent major depressive disorder (HCC) 12/19/2015   HIV disease (HCC) 12/18/2015   Suicidal behavior 12/18/2015    Patient's Medications  New Prescriptions   No medications on file  Previous Medications   CABOTEGRAVIR  & RILPIVIRINE  ER (CABENUVA ) 600 & 900 MG/3ML INJECTION    Inject 1 kit into the muscle every 2 (two) months.   CHLORHEXIDINE  (PERIDEX ) 0.12 % SOLUTION    RINSE AND GARGLE 15 ML BY MOUTH OR THROAT TWICE DAILY AS DIRECTED   FLUOXETINE  (PROZAC ) 10 MG TABLET    Take 1 tablet (10 mg total) by mouth daily.   GABAPENTIN  (NEURONTIN ) 300 MG CAPSULE    Take 1 capsule (300 mg total) by mouth at bedtime.   HYDROXYZINE  (ATARAX ) 25 MG TABLET    Take 1 tablet (25 mg total) by mouth 3 (three) times daily as needed.   MELOXICAM  (MOBIC ) 7.5 MG TABLET    Take 1 tablet (7.5 mg total) by mouth daily.   METRONIDAZOLE  (FLAGYL ) 500 MG TABLET    Take 1 tablet (500 mg total) by mouth 2 (two) times daily.   NAPROXEN  (NAPROSYN ) 500 MG TABLET    Take 1 tablet (500 mg total) by mouth 2 (two) times daily with a meal.   ONDANSETRON  (ZOFRAN ) 4 MG TABLET    Take 1 tablet (4 mg total) by mouth every 8 (eight) hours as needed for nausea or vomiting.   ONDANSETRON  (ZOFRAN -ODT) 8 MG DISINTEGRATING TABLET    Take 1 tablet (8 mg total) by mouth every 8 (eight) hours as needed for nausea or vomiting.   TRIAMCINOLONE  CREAM (KENALOG ) 0.1 %    Apply 1 Application topically 2 (two) times daily.  Modified Medications   No medications on file  Discontinued Medications   No  medications on file    Allergies: No Known Allergies  Past Medical History: Past Medical History:  Diagnosis Date   Alcohol abuse 05/20/2016   History of suicide attempt 05/20/2016   HIV antibody positive (HCC)    HIV disease (HCC) 01/15/2016   Multiple personality disorder (HCC) 05/20/2016    Social History: Social History   Socioeconomic History   Marital status: Significant Other    Spouse name: Not on file   Number of children: Not on file   Years of education: Not on file   Highest education level: Associate degree: occupational, Scientist, product/process development, or vocational program  Occupational History   Not on file  Tobacco Use   Smoking status: Never   Smokeless tobacco: Never  Vaping Use   Vaping status: Some Days  Substance and Sexual Activity   Alcohol use: Not Currently    Comment: socially   Drug use: Yes    Types: Marijuana   Sexual activity: Not Currently    Partners: Male  Other Topics Concern   Not on file  Social History Narrative   Not on file   Social Drivers of Health   Financial Resource Strain: Medium Risk (01/01/2024)   Overall Financial Resource Strain (CARDIA)    Difficulty of Paying Living Expenses: Somewhat  hard  Food Insecurity: Food Insecurity Present (01/01/2024)   Hunger Vital Sign    Worried About Running Out of Food in the Last Year: Sometimes true    Ran Out of Food in the Last Year: Sometimes true  Transportation Needs: Unmet Transportation Needs (01/01/2024)   PRAPARE - Transportation    Lack of Transportation (Medical): Yes    Lack of Transportation (Non-Medical): Yes  Physical Activity: Insufficiently Active (01/01/2024)   Exercise Vital Sign    Days of Exercise per Week: 2 days    Minutes of Exercise per Session: 10 min  Stress: Stress Concern Present (01/01/2024)   [employer] of Occupational Health - Occupational Stress Questionnaire    Feeling of Stress: To some extent  Social Connections: Socially Isolated (01/01/2024)   Social  Connection and Isolation Panel    Frequency of Communication with Friends and Family: More than three times a week    Frequency of Social Gatherings with Friends and Family: Patient declined    Attends Religious Services: Patient declined    Database administrator or Organizations: No    Attends Banker Meetings: Not on file    Marital Status: Separated    Labs: Lab Results  Component Value Date   HIV1RNAQUANT <20 DETECTED (A) 01/01/2024   HIV1RNAQUANT NOT DETECTED 10/22/2023   HIV1RNAQUANT Not Detected 08/25/2023   CD4TABS 1,117 01/01/2024   CD4TABS 933 02/27/2023   CD4TABS 989 08/26/2022    RPR and STI Lab Results  Component Value Date   LABRPR NON-REACTIVE 01/01/2024   LABRPR NON-REACTIVE 08/25/2023   LABRPR NON-REACTIVE 02/27/2023   LABRPR NON-REACTIVE 10/29/2021   LABRPR NON-REACTIVE 08/29/2021    STI Results GC CT  01/01/2024  9:10 AM Negative  Negative   01/01/2023  9:07 AM Negative  Negative   02/20/2022  9:01 AM Negative  Negative   09/05/2021  2:40 PM Negative  Negative   09/05/2021 10:25 AM Negative  Negative   05/20/2016 12:00 AM Negative  Negative     Hepatitis B Lab Results  Component Value Date   HEPBSAB POS (A) 01/15/2016   HEPBSAG NEGATIVE 01/15/2016   Hepatitis C No results found for: HEPCAB, HCVRNAPCRQN Hepatitis A Lab Results  Component Value Date   HAV NON-REACTIVE 04/26/2021   Lipids: Lab Results  Component Value Date   CHOL 151 01/01/2024   TRIG 56 01/01/2024   HDL 49 (L) 01/01/2024   CHOLHDL 3.1 01/01/2024   LDLCALC 88 01/01/2024    TARGET DATE:  The 22nd of the month  Assessment: Brenda Riley presents today for their maintenance Cabenuva  injections. Initial/past injections were tolerated well without issues. No problems with systemic effects of injections.   Administered cabotegravir  600mg /22mL in left upper outer quadrant of the gluteal muscle. Administered rilpivirine  900 mg/3mL in the right upper outer quadrant  of the gluteal muscle. Monitored patient for 10 minutes after injection. Injections were tolerated well without issue. Patient will follow up in 2 months for next injection. Will defer HIV RNA today as it was recently assessed in June.   Eligible for Shingles vaccination which she accepts today; due for 2/2 Shingles in 2-6 months.   Plan: - Cabenuva  injections administered - Administer 1/2 Shingles vaccine - Next injections scheduled for 10/21 with Cassie and 12/15 with Corean  - Call with any issues or questions  Alan Geralds, PharmD, CPP, BCIDP, AAHIVP Clinical Pharmacist Practitioner Infectious Diseases Clinical Pharmacist [facility]

## 2024-02-16 NOTE — Telephone Encounter (Signed)
 [facility] Patient Advocate Encounter  Patient's medications Cabenuva  have been couriered to [facility] from [facility] and will be administered at the patients appointment on 02/20/24.  Arland Hutchinson, CPhT Specialty Pharmacy Patient [facility] for [facility] Phone: [phone] Fax:  [phone]

## 2024-02-20 ENCOUNTER — Ambulatory Visit: Admitting: Pharmacist

## 2024-02-20 ENCOUNTER — Other Ambulatory Visit: Payer: Self-pay

## 2024-02-20 DIAGNOSIS — Z23 Encounter for immunization: Secondary | ICD-10-CM | POA: Diagnosis not present

## 2024-02-20 DIAGNOSIS — B20 Human immunodeficiency virus [HIV] disease: Secondary | ICD-10-CM | POA: Diagnosis not present

## 2024-02-20 MED ORDER — CABOTEGRAVIR & RILPIVIRINE ER 600 & 900 MG/3ML IM SUER
1.0000 | Freq: Once | INTRAMUSCULAR | Status: AC
  Administered 2024-02-20: 1 via INTRAMUSCULAR

## 2024-03-19 ENCOUNTER — Encounter: Admitting: Obstetrics and Gynecology

## 2024-04-16 ENCOUNTER — Telehealth: Payer: Self-pay

## 2024-04-16 ENCOUNTER — Other Ambulatory Visit: Payer: Self-pay

## 2024-04-16 ENCOUNTER — Other Ambulatory Visit (HOSPITAL_COMMUNITY): Payer: Self-pay

## 2024-04-16 NOTE — Progress Notes (Signed)
 Specialty Pharmacy Refill Coordination Note  Brenda Riley is a 33 y.o. female assessed today regarding refills of clinic administered specialty medication(s) Cabotegravir  & Rilpivirine  (CABENUVA )   Clinic requested Courier to Provider Office   Delivery date: 04/22/24  Verified address: [address] [place] [address]   Medication will be filled on 04/21/24.

## 2024-04-16 NOTE — Telephone Encounter (Signed)
 error

## 2024-04-21 ENCOUNTER — Other Ambulatory Visit: Payer: Self-pay

## 2024-04-22 ENCOUNTER — Telehealth: Payer: Self-pay

## 2024-04-22 NOTE — Telephone Encounter (Signed)
 [facility] Patient Advocate Encounter  Patient's medications CABENUVA  have been couriered to [facility] from [facility] and will be administered at the patients appointment on 04/27/24.  [name], CPhT Specialty Pharmacy Patient [facility] for Infectious Disease Phone: [phone] Fax:  [phone]

## 2024-04-27 ENCOUNTER — Ambulatory Visit: Admitting: Pharmacist

## 2024-05-04 NOTE — Progress Notes (Unsigned)
 HPI: Brenda Riley is a 33 y.o. female who presents to the [facility] pharmacy clinic for Cabenuva  administration.  Referring ID Provider: Corean Fireman, ID NP  Patient Active Problem List   Diagnosis Date Noted   Broken tooth 06/24/2022   Preventative health care 01/01/2022   Paresthesia of both hands 03/08/2021   History of suicide attempt 05/20/2016   Severe bipolar I disorder, current or most recent episode depressed (HCC) 05/20/2016   Anxiety 01/15/2016   Severe episode of recurrent major depressive disorder (HCC) 12/19/2015   HIV disease (HCC) 12/18/2015   Suicidal behavior 12/18/2015    Patient's Medications  New Prescriptions   No medications on file  Previous Medications   CABOTEGRAVIR  & RILPIVIRINE  ER (CABENUVA ) 600 & 900 MG/3ML INJECTION    Inject 1 kit into the muscle every 2 (two) months.   CHLORHEXIDINE  (PERIDEX ) 0.12 % SOLUTION    RINSE AND GARGLE 15 ML BY MOUTH OR THROAT TWICE DAILY AS DIRECTED   FLUOXETINE  (PROZAC ) 10 MG TABLET    Take 1 tablet (10 mg total) by mouth daily.   GABAPENTIN  (NEURONTIN ) 300 MG CAPSULE    Take 1 capsule (300 mg total) by mouth at bedtime.   HYDROXYZINE  (ATARAX ) 25 MG TABLET    Take 1 tablet (25 mg total) by mouth 3 (three) times daily as needed.   MELOXICAM  (MOBIC ) 7.5 MG TABLET    Take 1 tablet (7.5 mg total) by mouth daily.   METRONIDAZOLE  (FLAGYL ) 500 MG TABLET    Take 1 tablet (500 mg total) by mouth 2 (two) times daily.   NAPROXEN  (NAPROSYN ) 500 MG TABLET    Take 1 tablet (500 mg total) by mouth 2 (two) times daily with a meal.   ONDANSETRON  (ZOFRAN ) 4 MG TABLET    Take 1 tablet (4 mg total) by mouth every 8 (eight) hours as needed for nausea or vomiting.   ONDANSETRON  (ZOFRAN -ODT) 8 MG DISINTEGRATING TABLET    Take 1 tablet (8 mg total) by mouth every 8 (eight) hours as needed for nausea or vomiting.   TRIAMCINOLONE  CREAM (KENALOG ) 0.1 %    Apply 1 Application topically 2 (two) times daily.  Modified Medications   No medications  on file  Discontinued Medications   No medications on file    Allergies: No Known Allergies  Labs: Lab Results  Component Value Date   HIV1RNAQUANT <20 DETECTED (A) 01/01/2024   HIV1RNAQUANT NOT DETECTED 10/22/2023   HIV1RNAQUANT Not Detected 08/25/2023   CD4TABS 1,117 01/01/2024   CD4TABS 933 02/27/2023   CD4TABS 989 08/26/2022    RPR and STI Lab Results  Component Value Date   LABRPR NON-REACTIVE 01/01/2024   LABRPR NON-REACTIVE 08/25/2023   LABRPR NON-REACTIVE 02/27/2023   LABRPR NON-REACTIVE 10/29/2021   LABRPR NON-REACTIVE 08/29/2021    STI Results GC CT  01/01/2024  9:10 AM Negative  Negative   01/01/2023  9:07 AM Negative  Negative   02/20/2022  9:01 AM Negative  Negative   09/05/2021  2:40 PM Negative  Negative   09/05/2021 10:25 AM Negative  Negative   05/20/2016 12:00 AM Negative  Negative     Hepatitis B Lab Results  Component Value Date   HEPBSAB POS (A) 01/15/2016   HEPBSAG NEGATIVE 01/15/2016   Hepatitis C No results found for: HEPCAB, HCVRNAPCRQN Hepatitis A Lab Results  Component Value Date   HAV NON-REACTIVE 04/26/2021   Lipids: Lab Results  Component Value Date   CHOL 151 01/01/2024   TRIG 56 01/01/2024  HDL 49 (L) 01/01/2024   CHOLHDL 3.1 01/01/2024   LDLCALC 88 01/01/2024    Target Date: The 22nd  Assessment: Brenda Riley presents today for her maintenance Cabenuva  injections. Past injections were tolerated well without issues. Last HIV RNA was undetectable in June. She has an itchy rash on the upper part of her back and neck. She has tried Kenalog  cream from her PCP without relief. She states that it has been an issue for a long time. I advised her to reach back out to her PCP and possibly get a referral to dermatology. She agrees with the plan.   Lab work:  None today  Eligible vaccinations:  Due for second shingles vaccine and updated influenza and COVID vaccines. She declines COVID, accepts flu, and defers shingles until  her December or February appointment.   Cabenuva : Administered cabotegravir  600mg /26mL in left upper outer quadrant of the gluteal muscle. Administered rilpivirine  900 mg/3mL in the right upper outer quadrant of the gluteal muscle. No issues with injections. She will follow up in 2 months for next set of injections.  Plan: - Cabenuva  injections administered - Administered 2025-2026 influenza vaccine  - Next injections scheduled for 06/21/24 with [facility] - Call with any issues or questions  Azeez Dunker L. Arther Heisler, PharmD, BCIDP, AAHIVP, CPP Clinical Pharmacist Practitioner - Infectious Diseases Clinical Pharmacist Lead - Specialty Pharmacy [facility] for [facility]

## 2024-05-05 ENCOUNTER — Other Ambulatory Visit: Payer: Self-pay

## 2024-05-05 ENCOUNTER — Ambulatory Visit: Admitting: Pharmacist

## 2024-05-05 DIAGNOSIS — Z21 Asymptomatic human immunodeficiency virus [HIV] infection status: Secondary | ICD-10-CM | POA: Diagnosis not present

## 2024-05-05 DIAGNOSIS — B20 Human immunodeficiency virus [HIV] disease: Secondary | ICD-10-CM

## 2024-05-05 DIAGNOSIS — Z23 Encounter for immunization: Secondary | ICD-10-CM | POA: Diagnosis not present

## 2024-05-05 MED ORDER — CABOTEGRAVIR & RILPIVIRINE ER 600 & 900 MG/3ML IM SUER
1.0000 | Freq: Once | INTRAMUSCULAR | Status: AC
  Administered 2024-05-05: 1 via INTRAMUSCULAR

## 2024-06-04 ENCOUNTER — Other Ambulatory Visit (HOSPITAL_COMMUNITY): Payer: Self-pay

## 2024-06-07 ENCOUNTER — Other Ambulatory Visit (HOSPITAL_COMMUNITY): Payer: Self-pay

## 2024-06-07 ENCOUNTER — Other Ambulatory Visit: Payer: Self-pay

## 2024-06-07 ENCOUNTER — Other Ambulatory Visit: Payer: Self-pay | Admitting: Pharmacist

## 2024-06-07 DIAGNOSIS — B20 Human immunodeficiency virus [HIV] disease: Secondary | ICD-10-CM

## 2024-06-07 MED ORDER — CABOTEGRAVIR & RILPIVIRINE ER 600 & 900 MG/3ML IM SUER
1.0000 | INTRAMUSCULAR | 5 refills | Status: AC
  Filled 2024-06-07: qty 6, 56d supply, fill #0
  Filled 2024-08-13: qty 6, 56d supply, fill #1

## 2024-06-07 NOTE — Progress Notes (Signed)
 Specialty Pharmacy Refill Coordination Note  Brenda Riley is a 33 y.o. female assessed today regarding refills of clinic administered specialty medication(s) Cabotegravir  & Rilpivirine  (CABENUVA )   Clinic requested Courier to Provider Office   Delivery date: 06/14/24   Verified address: [address] [place] [address]   Medication will be filled on 06/11/24.

## 2024-06-11 ENCOUNTER — Other Ambulatory Visit: Payer: Self-pay

## 2024-06-14 ENCOUNTER — Telehealth: Payer: Self-pay

## 2024-06-14 NOTE — Telephone Encounter (Signed)
 [facility] Patient Advocate Encounter  Patient's medications Cabenuva  have been couriered to [facility] from [facility] and will be administered at the patients appointment on 06/21/24.  Arland Hutchinson, CPhT Specialty Pharmacy Patient [facility] for Infectious Disease Phone: [phone] Fax:  [phone]

## 2024-06-21 ENCOUNTER — Ambulatory Visit: Payer: Self-pay | Admitting: Infectious Diseases

## 2024-06-21 ENCOUNTER — Other Ambulatory Visit: Payer: Self-pay

## 2024-06-21 ENCOUNTER — Encounter: Payer: Self-pay | Admitting: Infectious Diseases

## 2024-06-21 VITALS — HR 88 | Temp 98.6°F | Wt 186.0 lb

## 2024-06-21 DIAGNOSIS — Z59819 Housing instability, housed unspecified: Secondary | ICD-10-CM | POA: Diagnosis not present

## 2024-06-21 DIAGNOSIS — Z79899 Other long term (current) drug therapy: Secondary | ICD-10-CM | POA: Diagnosis not present

## 2024-06-21 DIAGNOSIS — B20 Human immunodeficiency virus [HIV] disease: Secondary | ICD-10-CM

## 2024-06-21 DIAGNOSIS — F314 Bipolar disorder, current episode depressed, severe, without psychotic features: Secondary | ICD-10-CM

## 2024-06-21 MED ORDER — CABOTEGRAVIR & RILPIVIRINE ER 600 & 900 MG/3ML IM SUER
1.0000 | Freq: Once | INTRAMUSCULAR | Status: AC
  Administered 2024-06-21: 10:00:00 1 via INTRAMUSCULAR

## 2024-06-21 MED ORDER — FLUOXETINE HCL 10 MG PO TABS: 10.0000 mg | ORAL_TABLET | Freq: Every day | ORAL | 5 refills | Status: DC

## 2024-06-21 NOTE — Progress Notes (Signed)
 Name: Brenda Riley  DOB: 07-15-1990 MRN: [id number] PCP: Jaycee Greig PARAS, NP    Brief Narrative:  Brenda Riley is a 33 y.o. female with HIV, Dx 09-2012 CD4 nadir > 200 reported HIV Risk: heterosexual contact History of OIs: none Intake Labs: Hep B sAg (-), sAb (+) 2017, Hep A (), Hep C () Quantiferon () HLA B*5701 () G6PD: ()   Previous Regimens: 2014 - kaletra + combivir  Stribild Tivicay  + Truvada 2017 Cabenuva  04-2021  Genotypes: None on file currently.   Subjective   Subjective:   Chief Complaint  Patient presents with   Follow-up    Discussed the use of AI scribe software for clinical note transcription with the patient, who gave verbal consent to proceed.  History of Present Illness   Brenda Riley is a 33 year old female with HIV who presents for her scheduled Cabenuva  injection.  She has been living with HIV since age 16 and is currently on Cabenuva  injections, which she finds convenient as it eliminates the need for daily medication.  She is experiencing persistent back pain and neck issues. Additionally, her menstrual period started three days ago, earlier than usual, which she attributes to stress and other factors.  She is currently facing housing instability due to an impending eviction from her apartment. She is two months behind on rent but has managed to gather most of the required funds. She has a court date scheduled for tomorrow and is seeking assistance from resources such as [employer] and [employer] to help with her housing situation.        12/04/2023    9:43 AM 07/18/2023   10:08 AM 01/01/2023    8:39 AM  Depression screen PHQ 2/9  Decreased Interest 1 2 0  Down, Depressed, Hopeless 2 2 1   PHQ - 2 Score 3 4 1   Altered sleeping 1 3   Tired, decreased energy 2 3   Change in appetite 2 3   Feeling bad or failure about yourself  3 2   Trouble concentrating 3 3   Moving slowly or  fidgety/restless 1 3   Suicidal thoughts 0 0   PHQ-9 Score 15  21    Difficult doing work/chores Very difficult Somewhat difficult      Data saved with a previous flowsheet row definition      12/04/2023    9:44 AM  GAD 7 : Generalized Anxiety Score  Nervous, Anxious, on Edge 2  Control/stop worrying 3  Worry too much - different things 3  Trouble relaxing 1  Restless 1  Easily annoyed or irritable 2  Afraid - awful might happen 3  Total GAD 7 Score 15  Anxiety Difficulty Very difficult     Review of Systems  Constitutional:  Negative for chills, fever, malaise/fatigue and weight loss.  Respiratory: Negative.    Cardiovascular: Negative.   Genitourinary: Negative.   Musculoskeletal: Negative.   Psychiatric/Behavioral:  Positive for depression. The patient is nervous/anxious.      Past Medical History:  Diagnosis Date   Alcohol abuse 05/20/2016   History of suicide attempt 05/20/2016   HIV antibody positive (HCC)    HIV disease (HCC) 01/15/2016   Multiple personality disorder (HCC) 05/20/2016    Outpatient Medications Prior to Visit  Medication Sig Dispense Refill   cabotegravir  & rilpivirine  ER (CABENUVA ) 600 & 900 MG/3ML injection Inject 1 kit into the muscle every 2 (two) months. 6 mL 5  chlorhexidine  (PERIDEX ) 0.12 % solution RINSE AND GARGLE 15 ML BY MOUTH OR THROAT TWICE DAILY AS DIRECTED 1893 mL 0   hydrOXYzine  (ATARAX ) 25 MG tablet Take 1 tablet (25 mg total) by mouth 3 (three) times daily as needed. 60 tablet 1   meloxicam  (MOBIC ) 7.5 MG tablet Take 1 tablet (7.5 mg total) by mouth daily. 90 tablet 0   metroNIDAZOLE  (FLAGYL ) 500 MG tablet Take 1 tablet (500 mg total) by mouth 2 (two) times daily. 14 tablet 0   naproxen  (NAPROSYN ) 500 MG tablet Take 1 tablet (500 mg total) by mouth 2 (two) times daily with a meal. 30 tablet 0   ondansetron  (ZOFRAN ) 4 MG tablet Take 1 tablet (4 mg total) by mouth every 8 (eight) hours as needed for nausea or vomiting. 20 tablet  0   ondansetron  (ZOFRAN -ODT) 8 MG disintegrating tablet Take 1 tablet (8 mg total) by mouth every 8 (eight) hours as needed for nausea or vomiting. 10 tablet 0   triamcinolone  cream (KENALOG ) 0.1 % Apply 1 Application topically 2 (two) times daily. 60 g 0   FLUoxetine  (PROZAC ) 10 MG tablet Take 1 tablet (10 mg total) by mouth daily. 30 tablet 5   gabapentin  (NEURONTIN ) 300 MG capsule Take 1 capsule (300 mg total) by mouth at bedtime. 90 capsule 0   No facility-administered medications prior to visit.     No Known Allergies  Social History   Tobacco Use   Smoking status: Never   Smokeless tobacco: Never  Vaping Use   Vaping status: Some Days  Substance Use Topics   Alcohol use: Not Currently    Comment: socially   Drug use: Yes    Types: Marijuana    No family history on file.  Social History   Substance and Sexual Activity  Sexual Activity Not Currently   Partners: Male     Objective    Objective:   Vitals:   06/21/24 0851  Pulse: 88  Temp: 98.6 F (37 C)  TempSrc: Oral  SpO2: 92%  Weight: 186 lb (84.4 kg)    Body mass index is 35.14 kg/m.  Physical Exam Constitutional:      Appearance: Normal appearance. She is not ill-appearing.  HENT:     Mouth/Throat:     Mouth: Mucous membranes are moist.     Pharynx: Oropharynx is clear.  Eyes:     General: No scleral icterus. Cardiovascular:     Rate and Rhythm: Normal rate and regular rhythm.  Pulmonary:     Effort: Pulmonary effort is normal.  Neurological:     Mental Status: She is oriented to person, place, and time.  Psychiatric:        Mood and Affect: Mood normal.        Thought Content: Thought content normal.     Lab Results Lab Results  Component Value Date   WBC 5.4 01/01/2024   HGB 12.3 01/01/2024   HCT 38.7 01/01/2024   MCV 91.1 01/01/2024   PLT 235 01/01/2024    Lab Results  Component Value Date   CREATININE 0.94 01/01/2024   BUN 12 01/01/2024   NA 138 01/01/2024   K 4.3  01/01/2024   CL 107 01/01/2024   CO2 24 01/01/2024    Lab Results  Component Value Date   ALT 12 01/01/2024   AST 18 01/01/2024   ALKPHOS 57 09/05/2021   BILITOT 0.3 01/01/2024    Lab Results  Component Value Date   CHOL 151 01/01/2024  HDL 49 (L) 01/01/2024   LDLCALC 88 01/01/2024   TRIG 56 01/01/2024   CHOLHDL 3.1 01/01/2024   HIV 1 RNA Quant  Date Value  01/01/2024 <20 DETECTED copies/mL (A)  10/22/2023 NOT DETECTED copies/mL  08/25/2023 Not Detected Copies/mL   CD4 T Cell Abs (/uL)  Date Value  01/01/2024 1,117  02/27/2023 933  08/26/2022 989     Assessment & Plan:     HIV infection - Well-managed with Cabenuva  long acting injections. She reports no issues with the medication and is due for her next dose today. Labs were deferred to February to accommodate patient request. She prefers to hold off on any vaccines today given it's her birthday and does not want to have side effects to anything  - Administered Cabenuva  injection today - Deferred labs to February - final Shingrix  dose in February (in 85m window from first)  - Will perform Pap smear in April when I see her again  - Dental referral for cleaning services   Housing instability She is experiencing housing instability due to eviction from her apartment. She is seeking assistance and has been provided with resources to help address this issue. - Provided contact information for [employer] [facility] - Provided contact information for [facility] at [facility]     Anxiety / Depression -  Continue Prozac  10 mg dose.   Meds ordered this encounter  Medications   cabotegravir  & rilpivirine  ER (CABENUVA ) 600 & 900 MG/3ML injection 1 kit   FLUoxetine  (PROZAC ) 10 MG tablet    Sig: Take 1 tablet (10 mg total) by mouth daily.    Dispense:  30 tablet    Refill:  5    Please deliver to patient   Orders Placed This Encounter  Procedures   AMB REFERRAL TO COMMUNITY SERVICE AGENCY    Referral  Priority:   Routine    Referral Type:   Community Service    Number of Visits Requested:   1   AMB REFERRAL TO COMMUNITY SERVICE AGENCY    Referral Priority:   Routine    Referral Type:   Community Service    Number of Visits Requested:   1   08/24/2024 - next cabenuva  appointment    Corean Fireman, MSN, NP-C [facility] Pager: [phone] Office: [phone]  06/21/2024  10:57 AM  This evaluation (history, exam, medical decision making) was significant and separately identifiable from the injection administration in the office today.

## 2024-06-21 NOTE — Patient Instructions (Addendum)
 08/24/2024 is your next appointment for your injection   [facility] -  [address]  [phone]  Please call [place] @ [phone] to schedule a dental appointment   Will update labs for you in February   Pap smear at your April appointment with me again please

## 2024-06-28 ENCOUNTER — Other Ambulatory Visit: Payer: Self-pay | Admitting: Infectious Diseases

## 2024-06-28 DIAGNOSIS — Z113 Encounter for screening for infections with a predominantly sexual mode of transmission: Secondary | ICD-10-CM

## 2024-06-28 DIAGNOSIS — B20 Human immunodeficiency virus [HIV] disease: Secondary | ICD-10-CM

## 2024-06-28 NOTE — Addendum Note (Signed)
 Addended by: MELVENIA COREAN SAILOR on: 06/28/2024 01:26 PM   Modules accepted: Orders

## 2024-07-12 ENCOUNTER — Telehealth: Admitting: Emergency Medicine

## 2024-07-12 DIAGNOSIS — H9201 Otalgia, right ear: Secondary | ICD-10-CM

## 2024-07-12 NOTE — Progress Notes (Signed)
" °  Because you are having tooth pain and ear pain, I am not able to sort out what the problem is by evisit, I feel your condition warrants further evaluation and I recommend that you be seen in a face-to-face visit.   NOTE: There will be NO CHARGE for this E-Visit   If you are having a true medical emergency, please call 911.     For an urgent face to face visit, [place] has multiple urgent care centers for your convenience.  Click the link below for the full list of locations and hours, walk-in wait times, appointment scheduling options and driving directions:  Urgent Care - [place], [place], [place], [place], [place], [place]  [place]     Your MyChart E-visit questionnaire answers were reviewed by a board certified advanced clinical practitioner to complete your personal care plan based on your specific symptoms.    Thank you for using e-Visits.    "

## 2024-07-19 ENCOUNTER — Other Ambulatory Visit: Payer: Self-pay | Admitting: Infectious Diseases

## 2024-07-19 DIAGNOSIS — F314 Bipolar disorder, current episode depressed, severe, without psychotic features: Secondary | ICD-10-CM

## 2024-07-19 NOTE — Telephone Encounter (Signed)
 Okay to refill

## 2024-07-20 ENCOUNTER — Telehealth: Admitting: Physician Assistant

## 2024-07-20 DIAGNOSIS — R112 Nausea with vomiting, unspecified: Secondary | ICD-10-CM

## 2024-07-20 DIAGNOSIS — R300 Dysuria: Secondary | ICD-10-CM

## 2024-07-20 NOTE — Progress Notes (Signed)
" °  Because of vomiting along with urinary symptoms, raising concern for more substantial infection and need for exam and urine testing, I feel your condition warrants further evaluation and I recommend that you be seen in a face-to-face visit.   NOTE: There will be NO CHARGE for this E-Visit   If you are having a true medical emergency, please call 911.     For an urgent face to face visit, [place] has multiple urgent care centers for your convenience.  Click the link below for the full list of locations and hours, walk-in wait times, appointment scheduling options and driving directions:  Urgent Care - [place], [place], [place], [place], [place], [place]  [place]     Your MyChart E-visit questionnaire answers were reviewed by a board certified advanced clinical practitioner to complete your personal care plan based on your specific symptoms.    Thank you for using e-Visits.    "

## 2024-07-28 ENCOUNTER — Other Ambulatory Visit: Payer: Self-pay

## 2024-07-28 ENCOUNTER — Ambulatory Visit

## 2024-08-12 ENCOUNTER — Other Ambulatory Visit (HOSPITAL_COMMUNITY): Payer: Self-pay

## 2024-08-12 ENCOUNTER — Other Ambulatory Visit: Payer: Self-pay

## 2024-08-12 NOTE — Progress Notes (Signed)
 Specialty Pharmacy Refill Coordination Note  Brenda Riley is a 35 y.o. female assessed today regarding refills of clinic administered specialty medication(s) Cabotegravir  & Rilpivirine  (CABENUVA )   Clinic requested Courier to Provider Office   Delivery date: 08/19/24   Verified address: [address] [place] [address]   Medication will be filled on 08/18/24.

## 2024-08-13 ENCOUNTER — Other Ambulatory Visit: Payer: Self-pay

## 2024-08-24 ENCOUNTER — Ambulatory Visit: Admitting: Pharmacist

## 2024-10-26 ENCOUNTER — Ambulatory Visit: Payer: Self-pay | Admitting: Pharmacist

## 2024-12-28 ENCOUNTER — Ambulatory Visit: Payer: Self-pay | Admitting: Infectious Diseases

## 2025-02-22 ENCOUNTER — Ambulatory Visit: Payer: Self-pay | Admitting: Pharmacist

## 2025-04-26 ENCOUNTER — Ambulatory Visit: Payer: Self-pay | Admitting: Pharmacist

## 2025-06-21 ENCOUNTER — Ambulatory Visit: Payer: Self-pay | Admitting: Infectious Diseases
